# Patient Record
Sex: Male | Born: 1970 | Race: White | Hispanic: No | Smoking: Former smoker
Health system: Southern US, Community
[De-identification: ages and names within clinical notes are randomized; demographics above are authoritative.]

## PROBLEM LIST (undated history)

## (undated) VITALS — BP 132/86 | HR 105 | Temp 97.5°F | Resp 20 | Ht 72.0 in | Wt 298.0 lb

## (undated) DIAGNOSIS — F1911 Other psychoactive substance abuse, in remission: Secondary | ICD-10-CM

## (undated) DIAGNOSIS — N451 Epididymitis: Secondary | ICD-10-CM

## (undated) DIAGNOSIS — K859 Acute pancreatitis without necrosis or infection, unspecified: Secondary | ICD-10-CM

## (undated) HISTORY — PX: CHOLECYSTECTOMY: SHX55

## (undated) HISTORY — PX: KNEE SURGERY: SHX244

---

## 2002-02-03 ENCOUNTER — Encounter: Payer: Self-pay | Admitting: Family Medicine

## 2002-02-03 ENCOUNTER — Inpatient Hospital Stay (HOSPITAL_COMMUNITY): Admission: EM | Admit: 2002-02-03 | Discharge: 2002-02-08 | Payer: Self-pay | Admitting: Internal Medicine

## 2002-02-04 ENCOUNTER — Encounter: Payer: Self-pay | Admitting: Pulmonary Disease

## 2002-02-07 ENCOUNTER — Encounter: Payer: Self-pay | Admitting: Pulmonary Disease

## 2002-02-09 ENCOUNTER — Inpatient Hospital Stay (HOSPITAL_COMMUNITY): Admission: AD | Admit: 2002-02-09 | Discharge: 2002-02-11 | Payer: Self-pay | Admitting: Internal Medicine

## 2002-02-13 ENCOUNTER — Encounter: Payer: Self-pay | Admitting: Emergency Medicine

## 2002-02-13 ENCOUNTER — Inpatient Hospital Stay (HOSPITAL_COMMUNITY): Admission: EM | Admit: 2002-02-13 | Discharge: 2002-02-16 | Payer: Self-pay | Admitting: Emergency Medicine

## 2002-02-17 ENCOUNTER — Encounter: Payer: Self-pay | Admitting: Emergency Medicine

## 2002-02-17 ENCOUNTER — Emergency Department (HOSPITAL_COMMUNITY): Admission: EM | Admit: 2002-02-17 | Discharge: 2002-02-17 | Payer: Self-pay | Admitting: Emergency Medicine

## 2002-02-20 ENCOUNTER — Emergency Department (HOSPITAL_COMMUNITY): Admission: EM | Admit: 2002-02-20 | Discharge: 2002-02-20 | Payer: Self-pay | Admitting: Emergency Medicine

## 2002-02-20 ENCOUNTER — Encounter: Payer: Self-pay | Admitting: Family Medicine

## 2004-05-02 ENCOUNTER — Ambulatory Visit: Payer: Self-pay | Admitting: Psychiatry

## 2004-05-02 ENCOUNTER — Inpatient Hospital Stay (HOSPITAL_COMMUNITY): Admission: RE | Admit: 2004-05-02 | Discharge: 2004-05-04 | Payer: Self-pay | Admitting: Psychiatry

## 2006-01-02 ENCOUNTER — Emergency Department (HOSPITAL_COMMUNITY): Admission: EM | Admit: 2006-01-02 | Discharge: 2006-01-03 | Payer: Self-pay | Admitting: Emergency Medicine

## 2006-05-19 ENCOUNTER — Emergency Department (HOSPITAL_COMMUNITY): Admission: EM | Admit: 2006-05-19 | Discharge: 2006-05-19 | Payer: Self-pay | Admitting: Emergency Medicine

## 2006-05-28 ENCOUNTER — Emergency Department (HOSPITAL_COMMUNITY): Admission: EM | Admit: 2006-05-28 | Discharge: 2006-05-28 | Payer: Self-pay | Admitting: Emergency Medicine

## 2006-05-29 ENCOUNTER — Observation Stay (HOSPITAL_COMMUNITY): Admission: EM | Admit: 2006-05-29 | Discharge: 2006-05-30 | Payer: Self-pay | Admitting: Emergency Medicine

## 2006-07-24 ENCOUNTER — Emergency Department (HOSPITAL_COMMUNITY): Admission: EM | Admit: 2006-07-24 | Discharge: 2006-07-24 | Payer: Self-pay | Admitting: Emergency Medicine

## 2007-05-31 ENCOUNTER — Emergency Department (HOSPITAL_COMMUNITY): Admission: EM | Admit: 2007-05-31 | Discharge: 2007-05-31 | Payer: Self-pay | Admitting: Emergency Medicine

## 2007-09-07 ENCOUNTER — Emergency Department (HOSPITAL_COMMUNITY): Admission: EM | Admit: 2007-09-07 | Discharge: 2007-09-07 | Payer: Self-pay | Admitting: Emergency Medicine

## 2007-10-10 ENCOUNTER — Emergency Department (HOSPITAL_COMMUNITY): Admission: EM | Admit: 2007-10-10 | Discharge: 2007-10-10 | Payer: Self-pay | Admitting: Emergency Medicine

## 2009-03-06 ENCOUNTER — Inpatient Hospital Stay (HOSPITAL_COMMUNITY): Admission: EM | Admit: 2009-03-06 | Discharge: 2009-03-11 | Payer: Self-pay | Admitting: Emergency Medicine

## 2009-03-10 ENCOUNTER — Encounter (INDEPENDENT_AMBULATORY_CARE_PROVIDER_SITE_OTHER): Payer: Self-pay | Admitting: General Surgery

## 2009-03-18 ENCOUNTER — Emergency Department (HOSPITAL_COMMUNITY): Admission: EM | Admit: 2009-03-18 | Discharge: 2009-03-18 | Payer: Self-pay | Admitting: Emergency Medicine

## 2009-03-19 ENCOUNTER — Emergency Department (HOSPITAL_COMMUNITY): Admission: EM | Admit: 2009-03-19 | Discharge: 2009-03-20 | Payer: Self-pay | Admitting: Emergency Medicine

## 2009-03-23 ENCOUNTER — Encounter (HOSPITAL_COMMUNITY): Admission: RE | Admit: 2009-03-23 | Discharge: 2009-04-06 | Payer: Self-pay | Admitting: General Surgery

## 2009-03-24 ENCOUNTER — Inpatient Hospital Stay (HOSPITAL_COMMUNITY): Admission: EM | Admit: 2009-03-24 | Discharge: 2009-03-27 | Payer: Self-pay | Admitting: Emergency Medicine

## 2009-03-24 ENCOUNTER — Ambulatory Visit: Payer: Self-pay | Admitting: Internal Medicine

## 2009-03-25 ENCOUNTER — Ambulatory Visit: Payer: Self-pay | Admitting: Gastroenterology

## 2009-03-27 ENCOUNTER — Telehealth (INDEPENDENT_AMBULATORY_CARE_PROVIDER_SITE_OTHER): Payer: Self-pay | Admitting: *Deleted

## 2009-03-27 ENCOUNTER — Ambulatory Visit: Payer: Self-pay | Admitting: Internal Medicine

## 2009-03-28 ENCOUNTER — Telehealth (INDEPENDENT_AMBULATORY_CARE_PROVIDER_SITE_OTHER): Payer: Self-pay | Admitting: *Deleted

## 2009-03-28 ENCOUNTER — Encounter: Payer: Self-pay | Admitting: Gastroenterology

## 2009-03-28 DIAGNOSIS — K831 Obstruction of bile duct: Secondary | ICD-10-CM | POA: Insufficient documentation

## 2009-04-01 ENCOUNTER — Emergency Department (HOSPITAL_COMMUNITY): Admission: EM | Admit: 2009-04-01 | Discharge: 2009-04-01 | Payer: Self-pay | Admitting: Emergency Medicine

## 2009-04-20 ENCOUNTER — Telehealth: Payer: Self-pay | Admitting: Gastroenterology

## 2009-04-20 ENCOUNTER — Ambulatory Visit: Payer: Self-pay | Admitting: Gastroenterology

## 2009-04-20 ENCOUNTER — Ambulatory Visit (HOSPITAL_COMMUNITY): Admission: RE | Admit: 2009-04-20 | Discharge: 2009-04-20 | Payer: Self-pay | Admitting: Gastroenterology

## 2009-05-02 ENCOUNTER — Inpatient Hospital Stay (HOSPITAL_COMMUNITY): Admission: AD | Admit: 2009-05-02 | Discharge: 2009-05-07 | Payer: Self-pay | Admitting: Family Medicine

## 2009-05-09 ENCOUNTER — Telehealth (INDEPENDENT_AMBULATORY_CARE_PROVIDER_SITE_OTHER): Payer: Self-pay | Admitting: *Deleted

## 2009-05-12 ENCOUNTER — Encounter: Payer: Self-pay | Admitting: Internal Medicine

## 2009-05-17 ENCOUNTER — Encounter: Payer: Self-pay | Admitting: Internal Medicine

## 2009-06-28 ENCOUNTER — Encounter (INDEPENDENT_AMBULATORY_CARE_PROVIDER_SITE_OTHER): Payer: Self-pay | Admitting: *Deleted

## 2009-08-13 ENCOUNTER — Emergency Department (HOSPITAL_COMMUNITY): Admission: EM | Admit: 2009-08-13 | Discharge: 2009-08-13 | Payer: Self-pay | Admitting: Emergency Medicine

## 2010-01-14 ENCOUNTER — Emergency Department (HOSPITAL_COMMUNITY): Admission: EM | Admit: 2010-01-14 | Discharge: 2010-01-14 | Payer: Self-pay | Admitting: Emergency Medicine

## 2010-02-25 ENCOUNTER — Emergency Department (HOSPITAL_COMMUNITY): Admission: EM | Admit: 2010-02-25 | Discharge: 2010-02-25 | Payer: Self-pay | Admitting: Emergency Medicine

## 2010-03-10 ENCOUNTER — Emergency Department (HOSPITAL_COMMUNITY): Admission: EM | Admit: 2010-03-10 | Discharge: 2010-03-11 | Payer: Self-pay | Admitting: Emergency Medicine

## 2010-06-14 ENCOUNTER — Emergency Department (HOSPITAL_COMMUNITY): Admission: EM | Admit: 2010-06-14 | Discharge: 2010-03-12 | Payer: Self-pay | Admitting: Emergency Medicine

## 2010-07-29 ENCOUNTER — Encounter: Payer: Self-pay | Admitting: Orthopedic Surgery

## 2010-09-26 LAB — COMPREHENSIVE METABOLIC PANEL
Albumin: 4 g/dL (ref 3.5–5.2)
Chloride: 104 mEq/L (ref 96–112)
GFR calc non Af Amer: >60 mL/min (ref >60)
Potassium: 3.9 mEq/L (ref 3.5–5.1)
Total Protein: 7.5 g/dL (ref 6.0–8.3)

## 2010-09-26 LAB — POCT CARDIAC MARKERS: Myoglobin, poc: 50.4 ng/mL (ref 12–200)

## 2010-09-26 LAB — DIFFERENTIAL
Basophils Relative: 1 % (ref 0–1)
Eosinophils Relative: 2 % (ref 0–5)
Lymphs Abs: 2.6 10*3/uL (ref 0.7–4.0)
Monocytes Absolute: 0.8 10*3/uL (ref 0.1–1.0)
Monocytes Relative: 10 % (ref 3–12)
Neutro Abs: 4.7 10*3/uL (ref 1.7–7.7)
Neutrophils Relative %: 56 % (ref 43–77)

## 2010-09-26 LAB — CBC
MCHC: 33.4 g/dL (ref 30.0–36.0)
MCV: 97.2 fL (ref 78.0–100.0)
RDW: 14.1 % (ref 11.5–15.5)
WBC: 8.4 10*3/uL (ref 4.0–10.5)

## 2010-10-11 LAB — CBC
HCT: 36.6 % — ABNORMAL LOW (ref 39.0–52.0)
HCT: 41.6 % (ref 39.0–52.0)
HCT: 41.7 % (ref 39.0–52.0)
Hemoglobin: 12.4 g/dL — ABNORMAL LOW (ref 13.0–17.0)
Hemoglobin: 14 g/dL (ref 13.0–17.0)
MCHC: 33.8 g/dL (ref 30.0–36.0)
MCV: 96.7 fL (ref 78.0–100.0)
MCV: 97.3 fL (ref 78.0–100.0)
Platelets: 330 10*3/uL (ref 150–400)
RBC: 3.78 MIL/uL — ABNORMAL LOW (ref 4.22–5.81)
RDW: 14.8 % (ref 11.5–15.5)
RDW: 15.5 % (ref 11.5–15.5)
WBC: 12.4 10*3/uL — ABNORMAL HIGH (ref 4.0–10.5)
WBC: 8.8 10*3/uL (ref 4.0–10.5)

## 2010-10-11 LAB — DIFFERENTIAL
Eosinophils Absolute: 0.2 10*3/uL (ref 0.0–0.7)
Eosinophils Relative: 1 % (ref 0–5)
Eosinophils Relative: 2 % (ref 0–5)
Eosinophils Relative: 3 % (ref 0–5)
Lymphocytes Relative: 25 % (ref 12–46)
Lymphs Abs: 2.4 10*3/uL (ref 0.7–4.0)
Lymphs Abs: 3.3 10*3/uL (ref 0.7–4.0)
Monocytes Absolute: 0.9 10*3/uL (ref 0.1–1.0)
Monocytes Absolute: 1.3 10*3/uL — ABNORMAL HIGH (ref 0.1–1.0)
Monocytes Relative: 10 % (ref 3–12)
Monocytes Relative: 11 % (ref 3–12)
Monocytes Relative: 11 % (ref 3–12)
Neutro Abs: 4.3 10*3/uL (ref 1.7–7.7)
Neutro Abs: 7.8 10*3/uL — ABNORMAL HIGH (ref 1.7–7.7)
Neutrophils Relative %: 49 % (ref 43–77)

## 2010-10-11 LAB — COMPREHENSIVE METABOLIC PANEL
AST: 13 U/L (ref 0–37)
Albumin: 3.7 g/dL (ref 3.5–5.2)
BUN: 8 mg/dL (ref 6–23)
Chloride: 107 mEq/L (ref 96–112)
Creatinine, Ser: 0.69 mg/dL (ref 0.4–1.5)
GFR calc Af Amer: >60 mL/min (ref >60)
Potassium: 3.8 mEq/L (ref 3.5–5.1)
Total Protein: 7.1 g/dL (ref 6.0–8.3)

## 2010-10-11 LAB — BASIC METABOLIC PANEL
CO2: 28 mEq/L (ref 19–32)
Chloride: 104 mEq/L (ref 96–112)
Chloride: 108 mEq/L (ref 96–112)
GFR calc Af Amer: >60 mL/min (ref >60)
GFR calc non Af Amer: >60 mL/min (ref >60)
Glucose, Bld: 86 mg/dL (ref 70–99)
Potassium: 4.1 mEq/L (ref 3.5–5.1)
Potassium: 4.4 mEq/L (ref 3.5–5.1)
Sodium: 140 mEq/L (ref 135–145)
Sodium: 140 mEq/L (ref 135–145)

## 2010-10-11 LAB — AMYLASE: Amylase: 611 U/L — ABNORMAL HIGH (ref 27–131)

## 2010-10-11 LAB — LIPID PANEL
LDL Cholesterol: 96 mg/dL (ref 0–99)
Total CHOL/HDL Ratio: 6.8 RATIO
Triglycerides: 243 mg/dL — ABNORMAL HIGH (ref <150)
VLDL: 49 mg/dL — ABNORMAL HIGH (ref 0–40)

## 2010-10-11 LAB — LIPASE, BLOOD
Lipase: 128 U/L — ABNORMAL HIGH (ref 11–59)
Lipase: 350 U/L — ABNORMAL HIGH (ref 11–59)

## 2010-10-12 LAB — COMPREHENSIVE METABOLIC PANEL
ALT: 15 U/L (ref 0–53)
ALT: 16 U/L (ref 0–53)
ALT: 26 U/L (ref 0–53)
ALT: 13 U/L (ref 0–53)
AST: 15 U/L (ref 0–37)
AST: 18 U/L (ref 0–37)
AST: 22 U/L (ref 0–37)
Albumin: 3.3 g/dL — ABNORMAL LOW (ref 3.5–5.2)
Albumin: 3.4 g/dL — ABNORMAL LOW (ref 3.5–5.2)
Albumin: 4 g/dL (ref 3.5–5.2)
Albumin: 4.1 g/dL (ref 3.5–5.2)
Alkaline Phosphatase: 73 U/L (ref 39–117)
Alkaline Phosphatase: 77 U/L (ref 39–117)
BUN: 11 mg/dL (ref 6–23)
BUN: 7 mg/dL (ref 6–23)
BUN: 9 mg/dL (ref 6–23)
BUN: 8 mg/dL (ref 6–23)
CO2: 30 mEq/L (ref 19–32)
CO2: 33 mEq/L — ABNORMAL HIGH (ref 19–32)
CO2: 24 mEq/L (ref 19–32)
Calcium: 10 mg/dL (ref 8.4–10.5)
Calcium: 10.1 mg/dL (ref 8.4–10.5)
Calcium: 9.1 mg/dL (ref 8.4–10.5)
Calcium: 9.7 mg/dL (ref 8.4–10.5)
Chloride: 100 mEq/L (ref 96–112)
Chloride: 97 mEq/L (ref 96–112)
Chloride: 99 mEq/L (ref 96–112)
Creatinine, Ser: 0.79 mg/dL (ref 0.4–1.5)
Creatinine, Ser: 0.84 mg/dL (ref 0.4–1.5)
Creatinine, Ser: 0.88 mg/dL (ref 0.4–1.5)
GFR calc Af Amer: >60 mL/min (ref >60)
GFR calc Af Amer: >60 mL/min (ref >60)
GFR calc Af Amer: >60 mL/min (ref >60)
GFR calc non Af Amer: >60 mL/min (ref >60)
GFR calc non Af Amer: >60 mL/min (ref >60)
GFR calc non Af Amer: >60 mL/min (ref >60)
Glucose, Bld: 101 mg/dL — ABNORMAL HIGH (ref 70–99)
Glucose, Bld: 114 mg/dL — ABNORMAL HIGH (ref 70–99)
Glucose, Bld: 88 mg/dL (ref 70–99)
Glucose, Bld: 103 mg/dL — ABNORMAL HIGH (ref 70–99)
Potassium: 4 mEq/L (ref 3.5–5.1)
Potassium: 4.1 mEq/L (ref 3.5–5.1)
Potassium: 4.5 mEq/L (ref 3.5–5.1)
Sodium: 132 mEq/L — ABNORMAL LOW (ref 135–145)
Sodium: 135 mEq/L (ref 135–145)
Sodium: 136 mEq/L (ref 135–145)
Sodium: 141 mEq/L (ref 135–145)
Total Bilirubin: 0.3 mg/dL (ref 0.3–1.2)
Total Bilirubin: 0.5 mg/dL (ref 0.3–1.2)
Total Bilirubin: 0.7 mg/dL (ref 0.3–1.2)
Total Protein: 6.8 g/dL (ref 6.0–8.3)
Total Protein: 7.6 g/dL (ref 6.0–8.3)
Total Protein: 8 g/dL (ref 6.0–8.3)

## 2010-10-12 LAB — AMYLASE
Amylase: 113 U/L (ref 27–131)
Amylase: 354 U/L — ABNORMAL HIGH (ref 27–131)
Amylase: 170 U/L — ABNORMAL HIGH (ref 27–131)
Amylase: 76 U/L (ref 27–131)

## 2010-10-12 LAB — URINALYSIS, ROUTINE W REFLEX MICROSCOPIC
Bilirubin Urine: NEGATIVE
Glucose, UA: NEGATIVE mg/dL
Hgb urine dipstick: NEGATIVE
Hgb urine dipstick: NEGATIVE
Ketones, ur: NEGATIVE mg/dL
Specific Gravity, Urine: 1.02 (ref 1.005–1.030)
Specific Gravity, Urine: 1.01 (ref 1.005–1.030)
pH: 7 (ref 5.0–8.0)
pH: 6 (ref 5.0–8.0)

## 2010-10-12 LAB — DIFFERENTIAL
Band Neutrophils: 0 % (ref 0–10)
Basophils Absolute: 0 10*3/uL (ref 0.0–0.1)
Basophils Absolute: 0 10*3/uL (ref 0.0–0.1)
Basophils Absolute: 0 10*3/uL (ref 0.0–0.1)
Basophils Absolute: 0.1 10*3/uL (ref 0.0–0.1)
Basophils Relative: 0 % (ref 0–1)
Eosinophils Absolute: 0.2 10*3/uL (ref 0.0–0.7)
Eosinophils Absolute: 0.3 10*3/uL (ref 0.0–0.7)
Eosinophils Absolute: 0.2 10*3/uL (ref 0.0–0.7)
Eosinophils Absolute: 0.2 10*3/uL (ref 0.0–0.7)
Eosinophils Relative: 2 % (ref 0–5)
Eosinophils Relative: 4 % (ref 0–5)
Eosinophils Relative: 1 % (ref 0–5)
Eosinophils Relative: 2 % (ref 0–5)
Lymphocytes Relative: 15 % (ref 12–46)
Lymphocytes Relative: 22 % (ref 12–46)
Lymphocytes Relative: 18 % (ref 12–46)
Lymphocytes Relative: 32 % (ref 12–46)
Lymphs Abs: 2.6 10*3/uL (ref 0.7–4.0)
Lymphs Abs: 3.8 10*3/uL (ref 0.7–4.0)
Lymphs Abs: 2.9 10*3/uL (ref 0.7–4.0)
Lymphs Abs: 3 10*3/uL (ref 0.7–4.0)
Monocytes Absolute: 0.9 10*3/uL (ref 0.1–1.0)
Monocytes Absolute: 1 10*3/uL (ref 0.1–1.0)
Monocytes Absolute: 1.1 10*3/uL — ABNORMAL HIGH (ref 0.1–1.0)
Monocytes Absolute: 1.3 10*3/uL — ABNORMAL HIGH (ref 0.1–1.0)
Monocytes Absolute: 1 10*3/uL (ref 0.1–1.0)
Monocytes Relative: 8 % (ref 3–12)
Monocytes Relative: 9 % (ref 3–12)
Neutro Abs: 12.4 10*3/uL — ABNORMAL HIGH (ref 1.7–7.7)
Neutro Abs: 6.2 10*3/uL (ref 1.7–7.7)
Neutro Abs: 7.1 10*3/uL (ref 1.7–7.7)
Neutro Abs: 13.5 10*3/uL — ABNORMAL HIGH (ref 1.7–7.7)
Neutro Abs: 5.4 10*3/uL (ref 1.7–7.7)
Neutrophils Relative %: 57 % (ref 43–77)
Neutrophils Relative %: 63 % (ref 43–77)
Neutrophils Relative %: 55 % (ref 43–77)
Neutrophils Relative %: 73 % (ref 43–77)

## 2010-10-12 LAB — HEPATIC FUNCTION PANEL
AST: 29 U/L (ref 0–37)
Albumin: 3.1 g/dL — ABNORMAL LOW (ref 3.5–5.2)
Alkaline Phosphatase: 90 U/L (ref 39–117)
Total Protein: 6 g/dL (ref 6.0–8.3)

## 2010-10-12 LAB — CBC
HCT: 39.1 % (ref 39.0–52.0)
HCT: 43.3 % (ref 39.0–52.0)
HCT: 43.7 % (ref 39.0–52.0)
HCT: 36.4 % — ABNORMAL LOW (ref 39.0–52.0)
HCT: 42.7 % (ref 39.0–52.0)
Hemoglobin: 13.7 g/dL (ref 13.0–17.0)
Hemoglobin: 14.7 g/dL (ref 13.0–17.0)
Hemoglobin: 15.1 g/dL (ref 13.0–17.0)
Hemoglobin: 14.7 g/dL (ref 13.0–17.0)
MCHC: 34 g/dL (ref 30.0–36.0)
MCHC: 34.1 g/dL (ref 30.0–36.0)
MCHC: 34.6 g/dL (ref 30.0–36.0)
MCHC: 34.5 g/dL (ref 30.0–36.0)
MCHC: 35 g/dL (ref 30.0–36.0)
MCV: 94.8 fL (ref 78.0–100.0)
MCV: 95.7 fL (ref 78.0–100.0)
MCV: 95.9 fL (ref 78.0–100.0)
MCV: 95.7 fL (ref 78.0–100.0)
MCV: 96.1 fL (ref 78.0–100.0)
MCV: 96.9 fL (ref 78.0–100.0)
Platelets: 331 10*3/uL (ref 150–400)
Platelets: 339 10*3/uL (ref 150–400)
Platelets: 401 10*3/uL — ABNORMAL HIGH (ref 150–400)
Platelets: 433 10*3/uL — ABNORMAL HIGH (ref 150–400)
Platelets: 219 10*3/uL (ref 150–400)
RBC: 3.81 MIL/uL — ABNORMAL LOW (ref 4.22–5.81)
RBC: 4.19 MIL/uL — ABNORMAL LOW (ref 4.22–5.81)
RBC: 4.56 MIL/uL (ref 4.22–5.81)
RBC: 3.77 MIL/uL — ABNORMAL LOW (ref 4.22–5.81)
RBC: 3.79 MIL/uL — ABNORMAL LOW (ref 4.22–5.81)
RBC: 4.47 MIL/uL (ref 4.22–5.81)
RDW: 13.3 % (ref 11.5–15.5)
RDW: 13.6 % (ref 11.5–15.5)
RDW: 13.6 % (ref 11.5–15.5)
RDW: 13.9 % (ref 11.5–15.5)
WBC: 10 10*3/uL (ref 4.0–10.5)
WBC: 16 10*3/uL — ABNORMAL HIGH (ref 4.0–10.5)
WBC: 7.8 10*3/uL (ref 4.0–10.5)
WBC: 9 10*3/uL (ref 4.0–10.5)
WBC: 9.7 10*3/uL (ref 4.0–10.5)

## 2010-10-12 LAB — BASIC METABOLIC PANEL
BUN: 3 mg/dL — ABNORMAL LOW (ref 6–23)
CO2: 28 mEq/L (ref 19–32)
Chloride: 111 mEq/L (ref 96–112)
GFR calc Af Amer: >60 mL/min (ref >60)
Potassium: 3.7 mEq/L (ref 3.5–5.1)

## 2010-10-12 LAB — LIPASE, BLOOD
Lipase: 145 U/L — ABNORMAL HIGH (ref 11–59)
Lipase: 333 U/L — ABNORMAL HIGH (ref 11–59)
Lipase: 88 U/L — ABNORMAL HIGH (ref 11–59)
Lipase: 100 U/L — ABNORMAL HIGH (ref 11–59)
Lipase: 36 U/L (ref 11–59)

## 2010-10-12 LAB — POCT CARDIAC MARKERS
CKMB, poc: <1 ng/mL — ABNORMAL LOW (ref 1.0–8.0)
Myoglobin, poc: 91.9 ng/mL (ref 12–200)

## 2010-10-13 LAB — RAPID URINE DRUG SCREEN, HOSP PERFORMED
Barbiturates: NOT DETECTED
Opiates: POSITIVE — AB

## 2010-10-13 LAB — CBC
HCT: 44.7 % (ref 39.0–52.0)
Hemoglobin: 13.3 g/dL (ref 13.0–17.0)
MCHC: 34.4 g/dL (ref 30.0–36.0)
MCV: 98 fL (ref 78.0–100.0)
Platelets: 322 10*3/uL (ref 150–400)
RBC: 3.94 MIL/uL — ABNORMAL LOW (ref 4.22–5.81)
WBC: 11.8 10*3/uL — ABNORMAL HIGH (ref 4.0–10.5)

## 2010-10-13 LAB — POCT CARDIAC MARKERS
CKMB, poc: <1 ng/mL — ABNORMAL LOW (ref 1.0–8.0)
Myoglobin, poc: 63.4 ng/mL (ref 12–200)
Troponin i, poc: <0.05 ng/mL (ref 0.00–0.09)

## 2010-10-13 LAB — HEPATIC FUNCTION PANEL
AST: 75 U/L — ABNORMAL HIGH (ref 0–37)
Alkaline Phosphatase: 91 U/L (ref 39–117)
Bilirubin, Direct: 0.1 mg/dL (ref 0.0–0.3)
Total Bilirubin: 0.5 mg/dL (ref 0.3–1.2)

## 2010-10-13 LAB — COMPREHENSIVE METABOLIC PANEL
AST: 52 U/L — ABNORMAL HIGH (ref 0–37)
Albumin: 4 g/dL (ref 3.5–5.2)
Alkaline Phosphatase: 93 U/L (ref 39–117)
BUN: 7 mg/dL (ref 6–23)
Chloride: 100 mEq/L (ref 96–112)
Potassium: 4.4 mEq/L (ref 3.5–5.1)
Total Bilirubin: 0.7 mg/dL (ref 0.3–1.2)

## 2010-10-13 LAB — LIPID PANEL
Cholesterol: 183 mg/dL (ref 0–200)
Total CHOL/HDL Ratio: 5.7 RATIO

## 2010-10-13 LAB — DIFFERENTIAL
Basophils Absolute: 0.1 10*3/uL (ref 0.0–0.1)
Basophils Relative: 0 % (ref 0–1)
Basophils Relative: 1 % (ref 0–1)
Eosinophils Absolute: 0.2 10*3/uL (ref 0.0–0.7)
Eosinophils Absolute: 0.2 10*3/uL (ref 0.0–0.7)
Eosinophils Relative: 1 % (ref 0–5)
Lymphs Abs: 3.3 10*3/uL (ref 0.7–4.0)
Monocytes Absolute: 0.7 10*3/uL (ref 0.1–1.0)
Monocytes Absolute: 1.2 10*3/uL — ABNORMAL HIGH (ref 0.1–1.0)
Monocytes Relative: 10 % (ref 3–12)
Neutro Abs: 7.2 10*3/uL (ref 1.7–7.7)

## 2010-11-16 IMAGING — CT CT ABDOMEN W/ CM
2 of 3 series · 12 of 36 positions shown, 17 images · IV contrast (Omnipaque 300)
Comparison: Abdominal ultrasound performed 03/06/2009

CT ABDOMEN

CLINICAL DATA: Abdominal pain and vomiting; status post
cholecystectomy 1 week ago.

CT ABDOMEN AND PELVIS WITH CONTRAST
TECHNIQUE: Multidetector CT imaging of the abdomen and pelvis was
performed using the standard protocol following bolus
administration of intravenous contrast.
Contrast: 100 mL Omnipaque 300 IV contrast

[Series 2: abd_pel_with 5.0 b40f · axial · 0.81mm/px · z∈[-574,-134]mm · 11 of 101 slices shown, 15 images]
[im 9/101  soft-tissue]
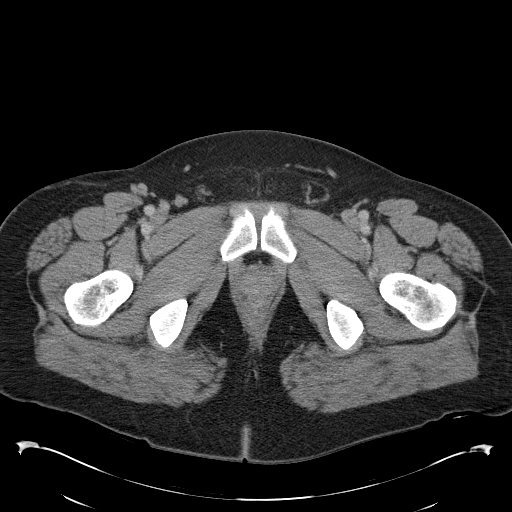
[im 9/101  bone]
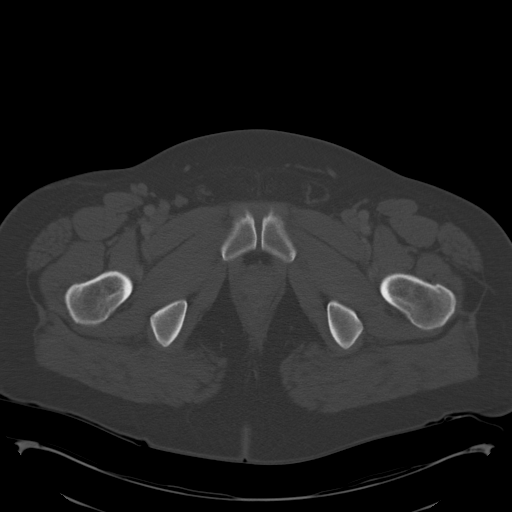
[im 21/101  soft-tissue]
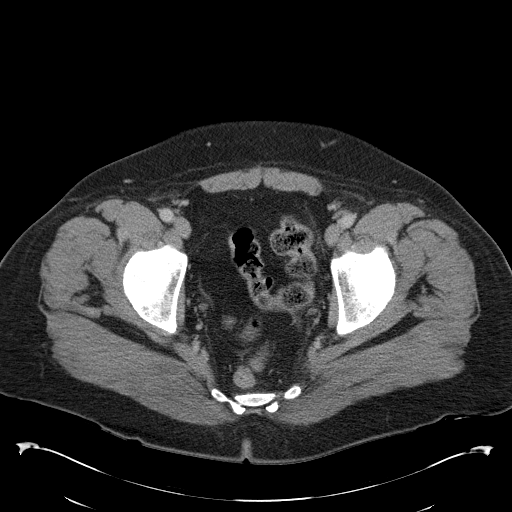
[im 29/101  soft-tissue]
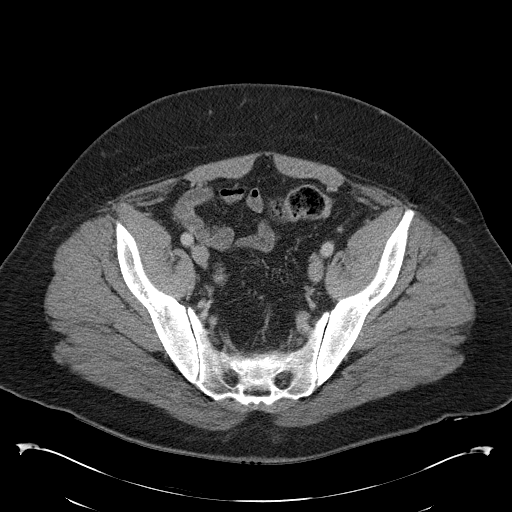
[im 41/101  soft-tissue]
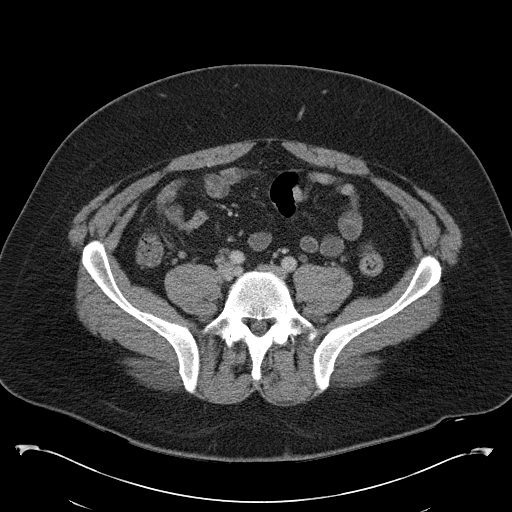
[im 53/101  soft-tissue]
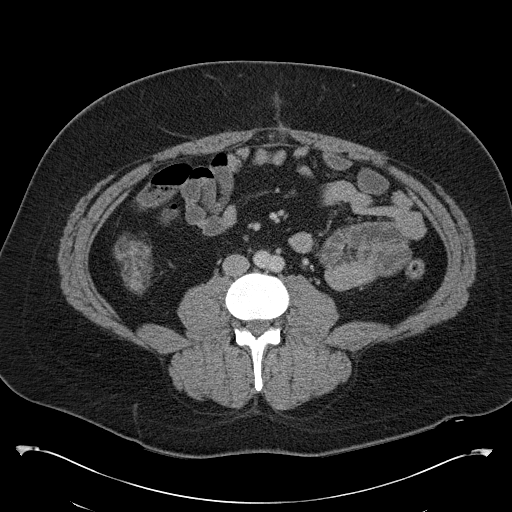
[im 61/101  soft-tissue]
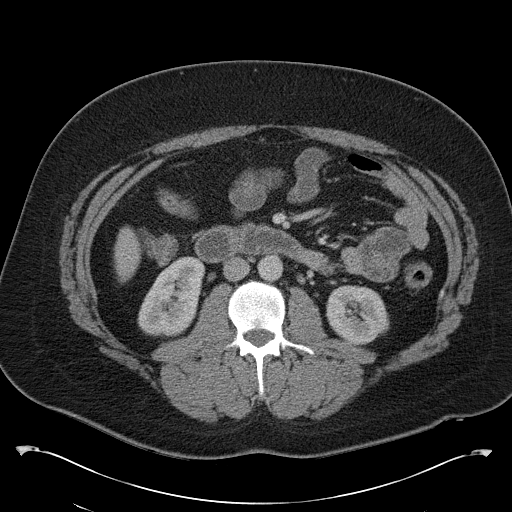
[im 73/101  soft-tissue]
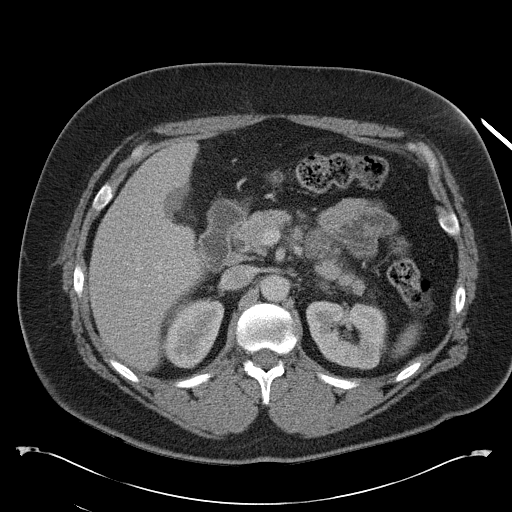
[im 85/101  soft-tissue]
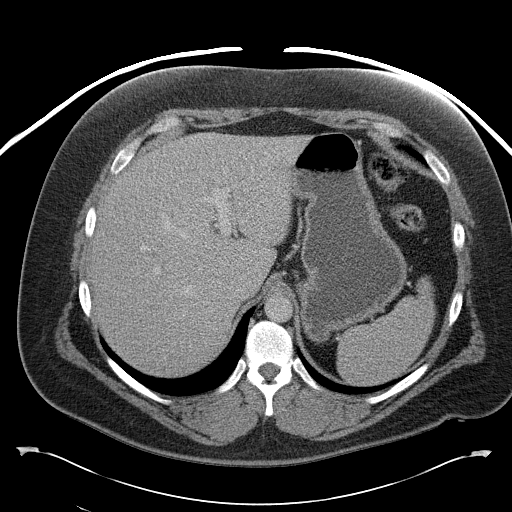
[im 85/101  lung]
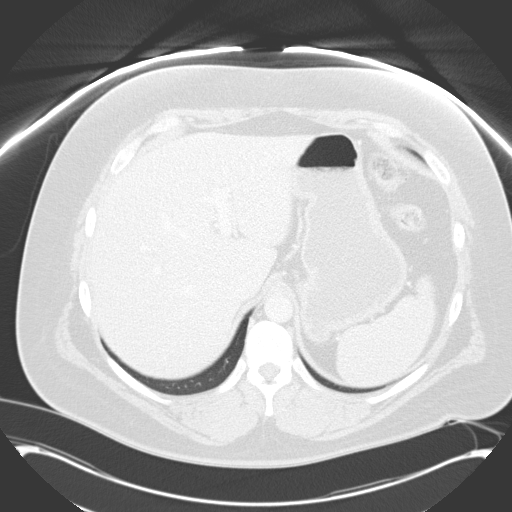
[im 89/101  lung]
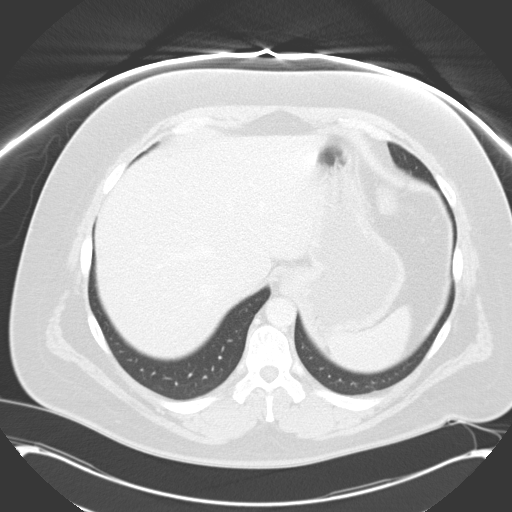
[im 93/101  soft-tissue]
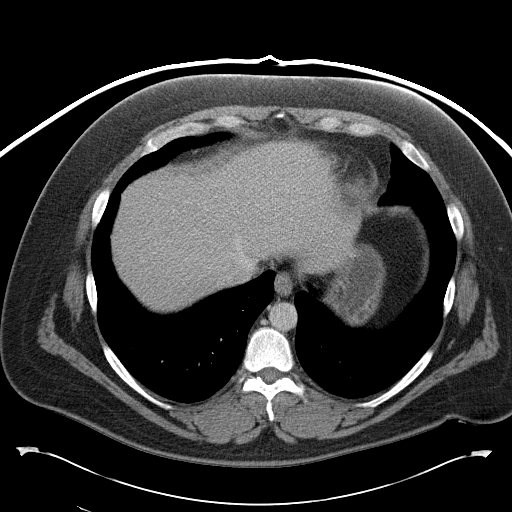
[im 93/101  lung]
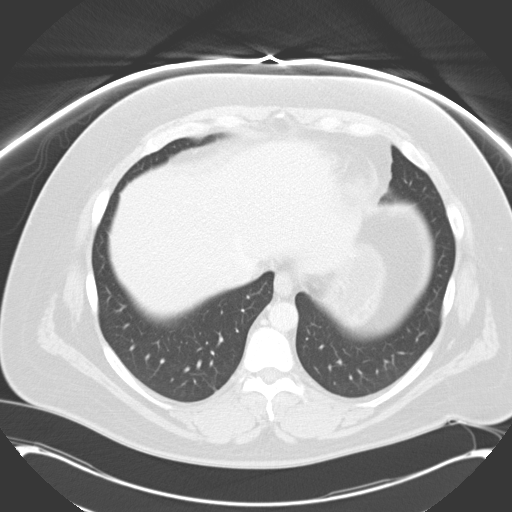
[im 93/101  bone]
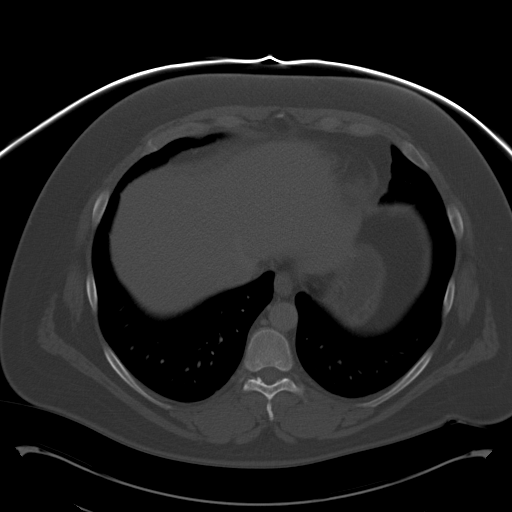
[im 97/101  lung]
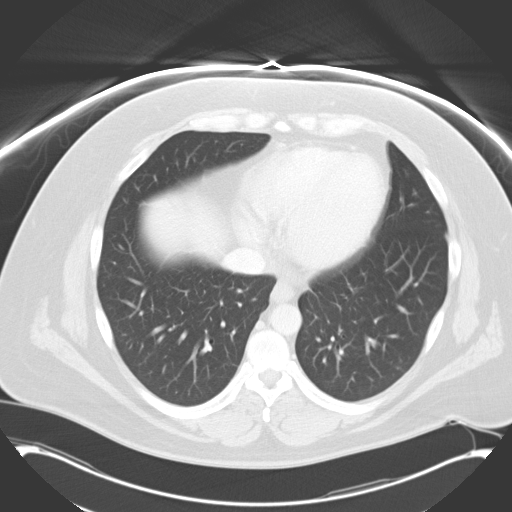

[Series 5: mpr sag post contrast (id) · sagittal · 0.71mm/px · 1 of 129 slices shown, 2 images]
[im 43/129  soft-tissue]
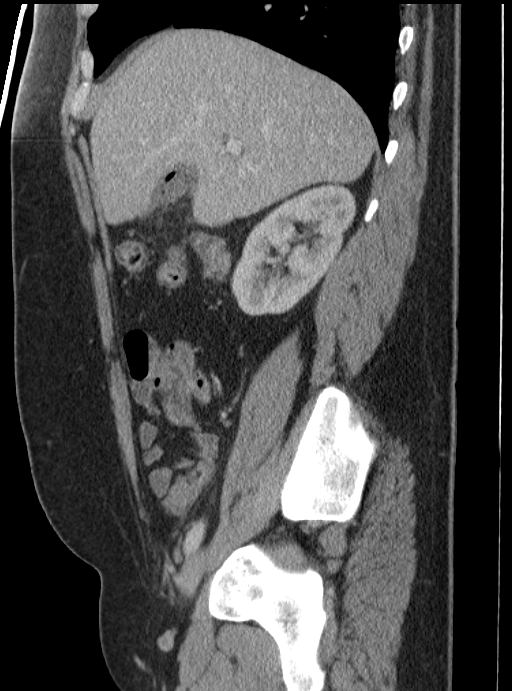
[im 43/129  bone]
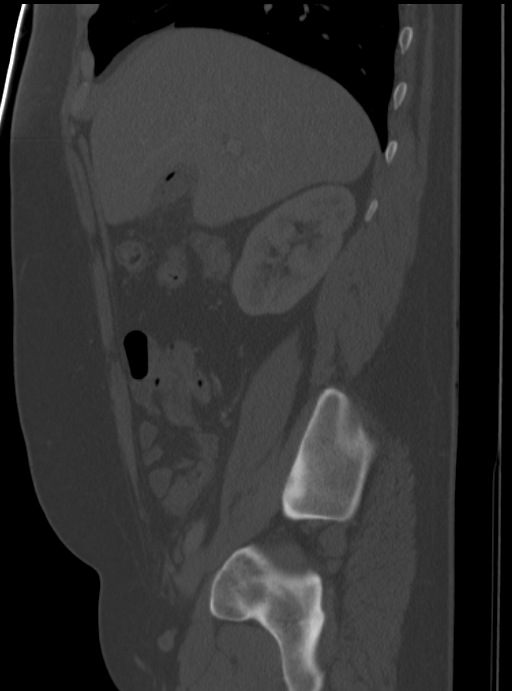

[12 of 36 positions shown; findings below may reference images not displayed]

FINDINGS: The visualized lung bases are clear.

A small collection of fluid and air is noted at the gallbladder
fossa, status post cholecystectomy.  This collection measures
approximately 3.4 x 3.1 x 2.5 cm.  There is no evidence of
peripheral enhancement to suggest abscess formation, but infection
cannot be excluded.  No retained stones are identified.  Adjacent
clips are noted.  There is no evidence of free fluid in the
remainder of the abdomen.  Postoperative stranding is noted in the
anterior abdominal wall.

The liver and spleen are unremarkable in appearance.  The common
bile duct remains normal in caliber, status post cholecystectomy.
The pancreas and adrenal glands are unremarkable.  The kidneys are
within normal limits bilaterally; no hydronephrosis or perinephric
stranding is seen.

A trace amount of intraperitoneal air is noted inferior to the
level of the umbilicus, likely reflecting minimal residual air
trapped in the omentum status post surgery.  The small bowel is
unremarkable in appearance.  The stomach is within normal limits.
No acute vascular abnormalities are seen.

No acute osseous abnormalities are identified.
IMPRESSION: 1.  Small 3.4 cm collection of fluid and air at the gallbladder
fossa, status post cholecystectomy; no evidence of peripheral
enhancement to suggest abscess formation, but infection cannot be
excluded.
2.  Trace amount of intraperitoneal air noted inferior to the
umbilicus, likely reflecting minimal residual air trapped in the
omentum status post surgery.  Postoperative stranding noted in the
anterior abdominal wall.
3.  Otherwise unremarkable CT of the abdomen.

CT PELVIS
FINDINGS: No free fluid is identified within the pelvis.  The colon
is unremarkable in appearance; the appendix is not readily
characterized.

The bladder is mildly distend and within normal limits. The
prostate is normal in size and unremarkable in appearance.  A small
left umbilical hernia is noted, containing only fat.  There is no
evidence of inguinal lymphadenopathy.

No acute osseous abnormalities are seen.
IMPRESSION: 1.  Small left umbilical hernia, containing only fat.
2.  Otherwise unremarkable CT of the pelvis.

## 2010-11-20 NOTE — H&P (Signed)
NAME:  [name] [name], [name] [name]                ACCOUNT NO.:  [id number]   MEDICAL RECORD NO.:  [id number]          PATIENT TYPE:  INP   LOCATION:  A311                          FACILITY:  [facility]   PHYSICIAN:  Scott A. Gerda Diss, MD    DATE OF BIRTH:  11/04/1970   DATE OF ADMISSION:  03/06/2009  DATE OF DISCHARGE:  LH                              HISTORY & PHYSICAL   CHIEF COMPLAINT:  Chest pain, dizziness, sweating   HISTORY OF PRESENT ILLNESS:  This 40 year old male relates he has had a  couple days of just not feeling good, slight nausea, some abdominal  discomfort, not his usual self.  Denied high fever.  Denies wheezing,  coughing, vomiting or diarrhea, but on the day of admission started  having chest pain and discomfort that he states was very intense and  became persistent discomfort in the upper epigastrium and lower chest,  radiating to his back.  Some nausea with him breaking out in sweats,  feeling chilled but no documented fever.  States the pain became  unbearable and he presented to the ER because he is worried about the  possibility of a heart attack.  He was evaluated there and felt to have  pancreatitis, and we were called to admit the patient.   PAST MEDICAL HISTORY:  Patient does not have a cardiac history.  Has a  history of chronic anxiety.  Also a history of left knee reconstruction.  In addition to this, has a family history of breast cancer,  hypertension, and heart disease.   Denies allergies but has had side effects to AMBIEN, CIPRO, HYCODAN, and  DARVOCET.   Denies smoking.  States he does not drink.  He is divorced, currently.   MEDICATIONS:  None although he does state he took some Vicodin recently  for a bad tooth that his dentist prescribed.  He also states he took an  Ativan a few days back that he had left over.   PHYSICAL EXAMINATION:  VITAL SIGNS:  Overall stable.  HEENT:  Tympanic membranes normal.  __________.  Moist mucous membranes.  NECK:  Supple.  CHEST:  Clear to auscultation.  No crackles.  Heart is regular.  ABDOMEN:  Soft with mild epigastric tenderness.  EXTREMITIES: No edema.  SKIN:  Warm, dry.  NEUROLOGIC:  Grossly normal.   LABS:  White count slightly elevated.  Sodium and potassium looks good.  Creatinine looks good.  GOT slightly elevated to GPT at 71.  Cardiac  enzymes negative.  Drug screen positive for opiates and benzodiazepines.  Lipase 1580.   X-RAY:  Stable cardiomegaly.   Abdominal ultrasound:  Multiple gallstones without biliary obstruction.   ASSESSMENT/PLAN:  Pancreatitis:  Could have been triggered by  gallstones.  Could have been triggered by elevated triglycerides.  Check  a lipid profile in the morning.  Recheck a lipase in the morning.  May  need GI consult if the patient's course does not show significant  improvement.  IV fluids, pain medicine, nausea medicine, Protonix.  Monitor closely.  Pain medicine as necessary.  Caution drowsiness.  To  be  rechecked again first thing in the morning.      Scott A. Gerda Diss, MD  Electronically Signed     SAL/MEDQ  D:  03/06/2009  T:  03/06/2009  Job:  [id number]

## 2010-11-20 NOTE — Consult Note (Signed)
NAME:  [name] [name], [name] [name]                ACCOUNT NO.:  [id number]   MEDICAL RECORD NO.:  [id number]          PATIENT TYPE:  INP   LOCATION:  A311                          FACILITY:  [facility]   PHYSICIAN:  Tilford Pillar, MD      DATE OF BIRTH:  1971/07/07   DATE OF CONSULTATION:  03/07/2009  DATE OF DISCHARGE:                                 CONSULTATION   REASON FOR CONSULTATION:  Acute abdominal pain and acute pancreatitis.   HISTORY OF PRESENT ILLNESS:  The patient is a 40 year old male,  relatively healthy, who presented to [facility] with acute  onset of epigastric pain.  There is some radiation to the back.  This  has been severe to the point where the patient felt initially he was  having a heart attack and thus presented to the emergency department.  He denies any exacerbating or relieving factors.  He has never had any  similar symptomatology in the past.  He denies any emesis, but has had  some slight nausea.  He has had no change in bowel movements.  No  melena.  No hematochezia.  He denies any jaundice.  He has had no fever  and chills.  On further discussion with the patient, he does state that  he has had some indigestion symptoms over the last month with nothing  this severe or this constant.   PAST MEDICAL HISTORY:  History of anxiety.   PAST SURGICAL HISTORY:  He has a previous left knee reconstruction.   MEDICATIONS:  Ativan.   ALLERGIES:  No known drug allergies.   SOCIAL HISTORY:  No tobacco.  No alcohol.  No recreational drug abuse.   PHYSICAL EXAMINATION:  VITAL SIGNS:  Temperature 97.1, heart rate 55,  respirations 20, blood pressure 137/92.  He is 98% O2 saturation on room  air.  GENERAL:  He is in a supine position in his hospital bed.  He is not in  any acute distress.  He is alert and oriented x3.  HEENT:  Scalp no deformities.  No masses.  EYES:  Pupils are equal,  round, reactive to light.  Extraocular movements are intact.  No  conjunctival pallor  or scleral icterus is noted.  Oral mucosa is pink.  Normal occlusion.  NECK:  Trachea is midline.  PULMONARY:  Unlabored respirations.  He is clear to auscultation  bilaterally.  CARDIOVASCULAR:  Regular rate and rhythm.  No murmurs or gallops are  apparent.  He has is 2+ radial and femoral pulses and 2+ dorsalis pedis  and posterior tibialis pulses bilaterally.  ABDOMEN:  Diminished bowel sounds.  Abdomen is obese, soft.  He has mild-  to-moderate epigastric abdominal pain.  He has no diffuse peritoneal  signs.  No masses or hernias were appreciated.  EXTREMITIES:  Warm and dry.   PERTINENT LABORATORY AND RADIOGRAPHIC STUDIES:  CBC, white blood cell  count 11.9, hemoglobin 13.3, hematocrit 38.7, platelets 262.  Basic  metabolic panel from admission was sodium of 140, potassium 4.4,  chloride 100, bicarb 32, BUN 7, creatinine 0.94, blood glucose 128.  Liver  enzymes are slightly elevated at 75 and 99 for AST and ALT  respectively.  Amylase and lipase are elevated at 41 and 276, again  respectively.  Lipase has dropped from admission of 1580 to the current  276.  Triglycerides are slightly elevated at 166.  Right upper quadrant  ultrasound demonstrates positive cholelithiasis.  No biliary tree  dilatation.   ASSESSMENT AND PLAN:  Acute pancreatitis.  At this time, I would be  highly suspicious of a gallstone etiology given the patient's clinical  history and presentation.  At this time, I did discuss with the patient  the pathophysiology of gallstone pancreatitis and will continue the  patient on continued bowel rest and IV fluid hydration as well as pain  analgesia.  Risks, benefits, and alternatives of a laparoscopic possible  open cholecystectomy were also discussed with the patient briefly at the  time of evaluation including, but not limited to risk of bleeding,  infection, bile leak, small bowel injury, bile duct injury as well as  possibility intraoperative cardiac and pulmonary  events.  At this time,  I did discuss with the patient the possibility of proceeding with a  cholecystectomy upon improvement of his pancreatitis and did discuss  with the patient the possible course including resolution of his pain  symptomatology, normalization of his amylase and lipase, normalization  of his leukocytosis, and initiation of oral intake.  I did discuss with  the patient that should he be advanced on the diet and tolerate this,  then we would plan to proceed with the operation the following day upon  tolerating regular diet.  He understands this and does wish to proceed  in this fashion at this time.  I will continue to follow this patient  during his hospitalization and appreciate the opportunity to assist in  this patient's care.      Tilford Pillar, MD  Electronically Signed     BZ/MEDQ  D:  03/07/2009  T:  03/08/2009  Job:  [id number]

## 2010-11-23 NOTE — Procedures (Signed)
NAME:  [name] [name], [name] [name]                ACCOUNT NO.:  [id number]   MEDICAL RECORD NO.:  [id number]          PATIENT TYPE:  EMS   LOCATION:  ED                            FACILITY:  [facility]   PHYSICIAN:  Donna Bernard, M.D.DATE OF BIRTH:  [birth year]   DATE OF PROCEDURE:  05/28/2006  DATE OF DISCHARGE:                                  STRESS TEST   Electrocardiogram reveals sinus tachycardia with no significant STT  changes.      Donna Bernard, M.D.  Electronically Signed     WSL/MEDQ  D:  07/27/2006  T:  07/27/2006  Job:  [id number]

## 2010-11-23 NOTE — H&P (Signed)
NAME:  [name] [name], [name] [name]                ACCOUNT NO.:  [id number]   MEDICAL RECORD NO.:  [id number]           PATIENT TYPE:   LOCATION:                                 FACILITY:   PHYSICIAN:  Donna Bernard, M.D.DATE OF BIRTH:  1970/11/11   DATE OF ADMISSION:  DATE OF DISCHARGE:  LH                              HISTORY & PHYSICAL   CHIEF COMPLAINT:  Swelling of the throat and lip, trouble swallowing.   SUBJECTIVE:  The patient is a 40 year old male who presented to the room  emergency room a couple nights ago after taking his very first dose of  Ambien.  The patient had a sense of shortness of breath and swelling and  a sense of tightness in his chest.  He had some itching on his skin.  The patient was given IV steroids and antihistamines and discharged home  with prednisone taper.  He returned to the hospital this morning two  days after this with a resurgence of rash that had appeared on his  palms.  This was accompanied by severe itching along with pain in the  hands.  He had swelling of his lower lip.  The patient also had swelling  of his throat and a sense of difficulty swallowing.  It did not affect  his voice.  The patient notes no major difficulty breathing at this  time.  The patient is compliant with his chronic medications which  include lorazepam p.r.n. 2 mg, Effexor 75 mg XR daily.   PAST SURGICAL HISTORY:  Remote knee surgery.   SOCIAL HISTORY:  Patient is divorced, two children.  No alcohol or  tobacco abuse.   PRIOR MEDICAL HISTORY:  Significant for pneumonia.   FAMILY HISTORY:  Noncontributory.   ALLERGIES:  MORPHINE and now AMBIEN.   REVIEW OF SYSTEMS:  Otherwise negative.   PHYSICAL EXAMINATION:  VITAL SIGNS:  142/80, O2 sat normal.  GENERAL:  The patient is alert, anxious appearing with significant  swelling of his lower lip, no obvious tachypnea.  HEENT: Swelling of the lip noted.  Throat appears mildly swollen also on  exam.  Neck is supple.  TMs  normal, fundi disks sharp.  LUNGS:  No  wheezes.  No stridor no tachypnea.  HEART:  Regular rate and rhythm.  ABDOMEN:  Soft.  EXTREMITIES:  Hands show urticarial rash with some apparent swelling.  NEURO:  Intact.   IMPRESSION:  Recurrent angioedema with throat symptoms of difficulty  swallowing accompanied by swelling in the lips.  The patient has been  given multiple medicines in the emergency room including intravenous  steroids, antihistamines and narcotic analgesics.  He still complained  of severe pain and distress in his hands along with severe itching.  The  patient also notes ongoing difficulty swallowing and a sense of fullness  in the throat.  With the angioedema present in his lips and his failure  on outpatient management and this his second visit to the emergency room  in 40 hours I feel he warrants an observation overnight in the hospital  for the purposes of intravenous therapy for  his severe allergic  reaction.   PLAN:  Will admit to monitor cardiac and respiratory monitoring, IV Solu-  Medrol and Benadryl.  Initiate H2 blockade with Zantac.  Will also cover  the patient's anxiety component with lorazepam.  Further orders as noted  chart.      Donna Bernard, M.D.  Electronically Signed     WSL/MEDQ  D:  05/30/2006  T:  05/30/2006  Job:  [id number]

## 2010-11-23 NOTE — Discharge Summary (Signed)
NAME:  [name] [name], [name] [name]                          ACCOUNT NO.:  [id number]   MEDICAL RECORD NO.:  [id number]                   PATIENT TYPE:  INP   LOCATION:  5704                                 FACILITY:  [facility]   PHYSICIAN:  Oley Balm. Sung Amabile, M.D. [facility]          DATE OF BIRTH:  05/12/1971   DATE OF ADMISSION:  02/03/2002  DATE OF DISCHARGE:  02/08/2002                                 DISCHARGE SUMMARY   DISCHARGE DIAGNOSES:  1. Community-acquired pneumonia, no organism specified.  2. Headache.  3  Back pain.   HISTORY OF PRESENT ILLNESS:  The patient is [name], white male,  primary patient of Dr. Gerda Diss, who was transferred to [facility]  after initially being evaluated at [facility] on February 03, 2002.  He presented to the emergency department with acute respiratory  distress and had ruled out for pulmonary embolism.  He did have radiographic  data consistent with pneumonia and was transferred to [facility] to  the care of pulmonary critical care specialist for further evaluation and  treatment.   LABORATORY DATA AND X-RAY FINDINGS:  PTT 44.  WBC 5.4, hemoglobin 14.1,  hematocrit 41.4, platelets 209.  Arterial blood gasses on 50% nonrebreather,  pH 7.35, pCO2 44, pO2 101 with [name] bicarb of 24.  On 40% FIO2, pH 7.39, pCO2  42, pO2 89.  Sodium 134, potassium 3.9, chloride 99, CO2 29, glucose 110,  BUN 8, creatinine 0.9, calcium 8.6.   Chest x-ray shows decrease in bibasilar atelectasis since February 04, 2002.  Please note that this patient is [name] human subject in [name] research trial  activated in adult patient with severe sepsis.  Protocol number [id number]  ([id number]), Shan Levans, M.D.   HOSPITAL COURSE:  1. BILATERAL COMMUNITY ACQUIRED PNEUMONIA WITH NO ORGANISM     SPECIFIED/RESPIRATORY FAILURE:  The patient was admitted to [facility] for bilateral pneumonia as noted on chest x-ray.  He was hypoxic     requiring  supplemental oxygen initially.  He responded to the IV     antimicrobial therapy along with the last trial.  By the day of     discharge, he was off of oxygen.  His O2 saturations rose to 94% on room     air.  His pulmonary status improved to the point he could ambulate     without shortness of breath.  Again, no organism was specified.  He will     be discharged to complete 10 days of antimicrobial therapy initially with     Tequin and then with Rocephin and with Ceftin p.o. b.i.d.   1. HEADACHES:  Headaches were most likely secondary to medications.  These     medications were stopped and his headache improved.   1. BACK PAIN:  For back pain, he was given Ultram.   DISCHARGE MEDICATIONS:  1. Ceftin 250  mg b.i.d. x5 days.  2. Ultram 50 mg one t.i.d. p.r.n.  3. Trial as instructed and this will be followed by the research foundation.   DIET:  Low salt, low fat, low calorie diet.   SPECIAL INSTRUCTIONS:  Call for any problems.   FOLLOW UP:  Follow up for chest x-ray with nurse practitioner Minor on  March 04, 2002, at 2 p.m.   DISPOSITION/CONDITION ON DISCHARGE:  Acute hypoxic respiratory failure has  resolved.  His pulmonary status has improved to the point where he no longer  requires supplemental oxygen.  He is being discharged home in improved  condition.     Stephen Minor, N.P.                       Oley Balm Sung Amabile, M.D. [facility]    SM/MEDQ  D:  02/08/2002  T:  02/08/2002  Job:  [id number]   cc:   Donna Bernard, M.D.  [address]  [place]  [place] [address]  Fax: [phone]   Charlcie Cradle. Delford Field, M.D. [facility]

## 2010-11-23 NOTE — Discharge Summary (Signed)
NAME:  [name] [name], [name] [name]                ACCOUNT NO.:  [id number]   MEDICAL RECORD NO.:  [id number]          PATIENT TYPE:  IPS   LOCATION:  0305                          FACILITY:  [facility]   PHYSICIAN:  Geoffery Lyons, M.D.      DATE OF BIRTH:  1971-02-19   DATE OF ADMISSION:  05/02/2004  DATE OF DISCHARGE:  05/04/2004                                 DISCHARGE SUMMARY   CHIEF COMPLAINT AND PRESENT ILLNESS:  This was the first admission to [facility] for this 40 year old, married, white male,  voluntarily admitted.  History of opiate abuse.  History of knee pain.  Taking hydrocodone to help him until his surgery in October.  Use has been  escalating.  Tried to stop on his own; significant withdrawal.  Feeling very  anxious.  Using few Ativan to control anxiety while getting off the opiates.   PAST PSYCHIATRIC HISTORY:  First time [employer].  No current  treatment.   ALCOHOL/DRUG HISTORY:  Smokes.  No alcohol or other drugs.   PAST MEDICAL HISTORY:  Torn cartilage left knee, surgery April 19, 2004.   MEDICATIONS:  Ativan as needed to come off the opioids.   PHYSICAL EXAMINATION:  Performed and failed to show any acute findings.   LABORATORY WORKUP:  CBC:  Hemoglobin 14.8, white blood cells 12.6.  Blood  chemistry within normal limits.  Liver profile within normal limits.  TSH  1.151.  Drug screening positive for benzodiazepines.   MENTAL STATUS EXAM:  Reveals an alert, cooperative male, pleasant.  Speech  normal rate, tempo and production.  Mood anxious.  Affect anxious.  Thought  processes logical, coherent and relevant.  Focusing on his wanting to detox,  quit taking opioids.  No delusions.  No suicide, no homicide ideas.  No  hallucinations.  Cognition well-preserved.   ADMISSION DIAGNOSES:   AXIS I:  1.  Anxiety disorder, not otherwise specified.  2.  Opiate dependence.   AXIS II:  No diagnosis.   AXIS III:  Knee pain.   AXIS IV:  Moderate.   AXIS  V:  Global Assessment of Functioning upon admission was 35; highest  Global Assessment of Functioning in the last year 70.   COURSE IN HOSPITAL:  He was admitted and started in individual and group  psychotherapy.  He was given Ambien for sleep.  He was detoxified with  clonidine.  He was maintained on Seroquel 50 every six hours as needed and  was started on Celexa 10 mg per day.  Ambien was discontinued.  He was given  trazodone that later was switched to [facility].  He was able to settle down.  He has been able to go through the detox.  Admitted that he was abusing the  pain medications, taking up to 1500 __________ daily.  On October 28th he  was in full contact with reality.  There were no suicide ideas, no homicide  ideas, no hallucinations, no delusions.  Got dependent on the opiates given  to him for his knee pain, but now he felt that he was very  much better.  He  felt that the medication was helping more for the detox.  He said that he  was ready to go.  As he was stable enough, he was endorsing no suicide, no  homicide ideation, we went ahead and discharged to outpatient follow-up.   DISCHARGE DIAGNOSES:   AXIS I:  1.  Anxiety disorder, not otherwise specified.  2.  Opiate dependence.   AXIS II:  No diagnosis.   AXIS III:  Knee pain.   AXIS IV:  Moderate.   AXIS V:  Global Assessment of Functioning upon discharge was 55.   DISCHARGE MEDICATIONS:  1.  Catapres 0.1 one four times a day for a day, then one twice a day for      two days, then one daily for a day.  2.  Celexa 20 mg per day.  3.  Trazodone 50 at bedtime as needed for sleep.   FOLLOW UP:  Leanord Hawking for therapy.     Farrel Gordon   IL/MEDQ  D:  05/30/2004  T:  05/30/2004  Job:  [id number]

## 2010-11-23 NOTE — H&P (Signed)
NAME:  [name] [name], [name] [name]                          ACCOUNT NO.:  [id number]   MEDICAL RECORD NO.:  [id number]                   PATIENT TYPE:  INP   LOCATION:  A309                                 FACILITY:  [facility]   PHYSICIAN:  Francoise Schaumann. Halm, D.O.                DATE OF BIRTH:  07-23-1970   DATE OF ADMISSION:  02/13/2002  DATE OF DISCHARGE:                                HISTORY & PHYSICAL   CHIEF COMPLAINT:  Chest pain.   BRIEF HISTORY:  The patient is [name] man who presents to the  emergency department with [name] two-day history of progressive pleuritic-type  chest pain mainly on his right side.  The patient has [name] recent medical  history of being in the hospital at [facility] over the last two weeks for  suspected pneumococcal pneumonia.  Two weeks ago, he presented to [facility]  Emergency Room with acute-onset respiratory distress.  His workup concluded  that there was no PE evident but he did have significant bilobar pneumonia  in his lower lung lobes noted on spiral CT.  He was admitted to [facility] ICU and placed on an antibiotic regimen.  Over the course of the  following week, he improved and was discharged.  Two days later, he was  readmitted to the hospital due to some continued fevers and double vision.  He was recently discharged two days ago.  At that time, he had some right-  sided pleuritic chest discomfort but this has progressed over the last two  days.   In the emergency room, the patient was noted to be tachypneic, in mild  respiratory distress, with evidence of hypoxia; he is admitted to the  hospital for further management.   PAST MEDICAL HISTORY:  As noted.  Previous to this, he has been very healthy  and has had no surgeries.   SOCIAL HISTORY:  The patient smokes occasionally but not even daily.  He has  no significant alcohol intake.  He has no occupational or environmental  exposures to toxins.   MEDICATIONS:  He has been on Tequin 400 mg daily.   ALLERGIES:  MORPHINE.   FAMILY HISTORY:  Family history is significant for some family members with  viral-type illnesses recently.  Otherwise, noncontributory.   REVIEW OF SYSTEMS:  The patient has had no fever in the last two days.  He  denies any significant cough throughout the last two weeks.  He has had no  sputum production.  He has had some headache noted earlier in the week.  He  has had no skin rashes or joint swelling.  He does admit to his testicles  being swollen and tender over the last couple of days.  He has had no  dysuria or GI symptoms.   PHYSICAL EXAMINATION:  VITAL SIGNS:  Initially in the emergency room, the  patient had [name] temperature of 98.8, pulse  of 116, respirations of 40 and  blood pressure of 149/85.  His O2 saturation was 97% on room air.  He seemed  to be more comfortable now on oxygen at 2 l/min.  GENERAL:  He is somewhat overweight but otherwise well-nourished and healthy  appearing.  He is medicated at the time of my interview and exam and is  somewhat slow and lethargic in his psychomotor responses.  He is alert and  oriented x3.  HEAD AND NECK:  Unremarkable.  He has no carotid bruit.  He has good carotid  upstrokes.  HEART:  Regular with no ectopy or murmur.  LUNGS:  Clear in all fields with no wheezing or crackles.  ABDOMEN:  Soft and nontender.  EXTREMITIES:  No edema.  He has no joint swelling.  NEUROLOGIC:  Exam is nonlateralizing with symmetric tendon reflexes.   LABORATORY STUDIES:  His D-dimer was elevated at 1.0.  His ABG shows [name] pH of  7.42, PCO2 of 35, PO2 of 73, consistent with moderate arterial and alveolar  mismatch.  His WBC is 14,700 with 79% neutrophils.  His hemoglobin is  normal.  Platelets are normal.   CT of the chest shows no evidence of pulmonary emboli.  He does have  evidence of bilateral dependent atelectasis in his lung bases.   IMPRESSION AND PLAN:  1. Status post pneumococcal pneumonia.  2. Bilateral atelectasis  causing his current hypoxia.  3. Pleuritic-type chest pain.   Plan will be to admit and watch him closely.  He will be placed on oxygen  and will be provided incentive spirometry and possibly pulmonary rehab as an  outpatient.  We will allow him to rest most of today and provide analgesia  and push his pulmonary status tomorrow with more exercise.   I have reviewed the care plan with the patient and his family and they are  in agreement.                                               Francoise Schaumann. Milford Cage, D.O.    [facility]  D:  02/13/2002  T:  02/15/2002  Job:  [id number]   cc:   Donna Bernard, M.D.  [address]  [place]  [place] [address]  Fax: [phone]

## 2010-11-23 NOTE — Consult Note (Signed)
NAME:  [name] [name], [name] [name]                          ACCOUNT NO.:  [id number]   MEDICAL RECORD NO.:  [id number]                   PATIENT TYPE:  INP   LOCATION:  5740                                 FACILITY:  [facility]   PHYSICIAN:  Deanna Artis. Sharene Skeans, M.D.           DATE OF BIRTH:  Dec 31, 1970   DATE OF CONSULTATION:  02/10/2002  DATE OF DISCHARGE:  02/11/2002                              NEUROLOGY CONSULTATION   CHIEF COMPLAINT:  Diplopia.   HISTORY OF THE PRESENT CONDITION:  I was asked by Dr. Sung Amabile to see the  patient in consultation to evaluate diplopia which appeared in the setting  of [name] community-acquired pneumonia.  The patient had been admitted to [facility] with severe respiratory distress in transfer from [facility]  on February 03, 2002.  He was evaluated for [name] pulmonary embolism, which was  ruled out, and placed under the care of [name] critical care specialist.   He required supplemental oxygen and was treated with antibiotics with steady  improvement in his condition over the six-day hospitalization.  The patient  was discharged to complete an outpatient antibiotic therapy.  The patient  complained of headaches which went away when medications were stopped.  He  presented the next day acutely ill with fever (temperature to 101.3),  general malaise, and diplopia.  He was admitted for further evaluation.  CT  scan of the brain carried out February 09, 2002 was normal.  CT scan of the  maxillofacial area was also normal, showing no sinusitis, no evidence of  abnormality within the orbits, including the muscles of the eye.  He  continued today to contain of diplopia that was most prominent when he laid  down on his back and looked up at the T.V.  This appeared to be horizontal  diplopia, was intermittent and went away for no particular reason.  As [name]  result of this, I was asked to see the patient to determine the etiology of  his dysfunction, to review his CT scan, and to make  recommendations for  further workup and treatment.   PAST MEDICAL HISTORY:  Negative except for pneumonia described above.   REVIEW OF SYSTEMS:  The patient no longer complains of fever.  He has some  soreness in his arms related to the IV sites and venipuncture.  He complains  that his left scrotal sac is larger than the right, that his testes are  tender, and that his penis seems smaller than it was before he was  catheterized.   The patient does not have other complaints referable to the head and neck,  lungs, GI, GU.  No rash, anemia, bruisability, diabetes, or thyroid disease.  No other neurologic complaints including dysarthria, dysphagia, ptosis.  He  complained of some difficulty swallowing and said that things seemed as if  they got caught in his throat but did not choke, nor did he have change  in  the tone or quality of his voice.  No facial weakness or numbness.  No axial  weakness or numbness.  No loss of bowel or bladder control, syncope,  seizures, tinnitus, vertigo, unsteady gait.  No seizures.   [name] 12-system review is otherwise unremarkable.   CURRENT MEDICATIONS:  1. Tequin 400 mg q.d.  2. Phenergan 25 mg q.4h. p.r.n. p.o. or IV.  3. Tylenol 650 mg q.4h. p.r.n.   ALLERGIES TO MEDICINES:  None.   INTOLERANCES:  MORPHINE (itching).   PAST SURGICAL HISTORY:  Left knee anterior cruciate ligament reconstruction.   FAMILY HISTORY:  Mother has Crohn's disease and is 50.  Father is in good  health except for intermittent gout, age 73 . Siblings are in good health.   SOCIAL HISTORY:  The patient is married with two children ages 2 and two.  He works at [employer].  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  He has remote tobacco use, five cigarettes per day for four  years, and drinks one to two beers per day.  He denies use of illicit drugs.   PHYSICAL EXAMINATION TODAY:   GENERAL:  This is [name] well-developed, well-  nourished, large, right-handed gentleman in no acute distress.  VITAL SIGNS:  Blood pressure 132/78, resting pulse 96, respirations 20,  temperature 98.6.  HEENT:  No signs of infection.  Supple neck, full range of motion.  No  cranial or cervical bruits, no scleral icterus.  LUNGS:  Clear to auscultation.  HEART:  No murmurs, pulses normal.  ABDOMEN:  Soft, nontender.  Bowel sounds normal.  No hepatosplenomegaly.  EXTREMITIES:  Well formed.  GENITOURINARY:  The patient's left scrotal sac is [name] little bit lower, testes  are somewhat tender.  There is no tenderness in the epididymis.  The penis  appears to be normal.  NEUROLOGIC:  Mental status:  Awake, alert, attentive, appropriate.  No  dysphagia or dyspraxia.  He is slightly anxious.  Cranial nerves:  Round  reactive pupils, normal fundi, full visual fields to double simultaneous  stimuli.  Extraocular movements full and conjugate.  OKN responses equal  bilaterally.  Symmetric facial strength and sensation.  Air conduction  greater than bone conduction bilaterally.  I had the patient use [name] red lens.  He had horizontal diplopia in left inferior gaze, vertical diplopia in  midline superior gaze, and horizontal diplopia in right superior gaze.  He  had monocular vision in all other excursions.  When I did the procedure [name]  second time he had monocular vision in all positions tested.   Motor examination:  Normal strength, tone, and mass.  Good fine motor  movements, no pronator drift.  Sensation intact to cold, vibration, and  stereoagnosis.  Cerebella examination:  Good finger-to-nose, rapid  repetitive movements.  No tremor, dyspraxia, or dysmetria.  Gait and station  were normal.  He could walk on his heels and his toes, perform [name] tandem  without difficulty.  Romberg response was negative.   IMPRESSION:  Diplopia, unknown etiology, 368.2.  Differential diagnosis would include conditions like  myasthenia gravis, thyroid disease (especially  Graves disease with thyrotoxicosis), Lambert-Eaton syndrome, and other  conditions causing infiltration of the muscles or pseudotumor of the orbits.   These are largely ruled out by the CT scan of the orbits and also the CT  scan of the brain.  At present time I do not  think that further neurologic  evaluation is necessary.  If symptoms of diplopia persist I would perform [name]  Tensilon test and acetylcholine receptor antibody panel to evaluate him for  myasthenia gravis, but I think that this is highly unlikely.  The patient  seems, to me, quite anxious.  He seemed reassured when I told him that the  brain and the orbits were normal.   I appreciate the opportunity to see him.  If you have questions or I can be  of assistance, do not hesitate to contact me.                                                Deanna Artis. Sharene Skeans, M.D.    [facility]  D:  02/10/2002  T:  02/15/2002  Job:  [id number]   cc:   Oley Balm. Sung Amabile, M.D. [facility]   Donna Bernard, M.D.  [address]  [place]  [place] [address]  Fax: [phone]

## 2010-11-23 NOTE — H&P (Signed)
NAME:  [name] [name], [name] [name]                          ACCOUNT NO.:  [id number]   MEDICAL RECORD NO.:  [id number]                   PATIENT TYPE:  INP   LOCATION:  5740                                 FACILITY:  [facility]   PHYSICIAN:  Casimiro Needle B. Sherene Sires, M.D. [facility]           DATE OF BIRTH:  Aug 25, 1970   DATE OF ADMISSION:  02/09/2002  DATE OF DISCHARGE:  02/11/2002                                HISTORY & PHYSICAL   CHIEF COMPLAINT:  1. Failure to thrive.  2. Diplopia, i.e. double vision.  3. Risk of community acquired pneumonia.  4. Febrile.   HISTORY OF PRESENT ILLNESS:  [name] [name] is [name] white male  admitted to [facility] on 02/03/2002 with community acquired  pneumonia and no organism specified.  He was discharged on 02/08/2002 with  improvement in his hypoxia and his general malaise.  He presents today as  acute sick call to see Dr. Sherene Sires.  Chest x-ray is unremarkable, but he is  febrile with [name] fever of 101.3, he has general malaise, and now with double  vision i.e. diplopia.  He was on the research trial called ADDRESS trial,  which is [name] double blind randomized study of Xigris versus placebo.  Due to  this continued fever despite being on antimicrobial therapy with failure to  thrive, and new neurological symptoms of double vision, will be admitted for  further evaluation and treatment.   PAST MEDICAL HISTORY:  1. Recent bilateral lower lobe pneumonia.  2. Otherwise negative.   ALLERGIES:  MORPHINE, questionable ROCEPHIN which caused nausea and  vomiting.   MEDICATIONS:  Rocephin 250 mg b.i.d. p.r.n., Ultram 50 mg t.i.d.   REVIEW OF SYSTEMS:  Negative, except noted on HPI.   SOCIAL HISTORY:  Occasional tobacco, occasional alcohol.   FAMILY HISTORY:  Noncontributory.  See HPI dated 02/03/2002.   PHYSICAL EXAMINATION:  GENERAL:  Well-nourished, well-developed white male who appears toxic.   HEENT:  No JVD is noted.  Oral and nasopharynx is unremarkable.   CHEST:  Lungs clear to auscultation.   CARDIAC:  Heart sounds are regular, rate, and rhythm.   ABDOMEN:  Soft, nontender, positive bowel sounds.   GU:  Voids.   EXTREMITIES:  Without edema and warm.   IMPRESSION/PLAN:  1. Febrile illness with questionable sinusitis versus brain abscess versus     new neurological dysfunction such as Guillain-Barre syndrome, therefore,     [name]. Will admit him to the hospital.     b. CT of the head and sinuses.     c. IV antibiotics.     d. Blood cultures.  2. ADDRESS trial candidate which is completed.     Katy Apo Sherene Sires, M.D. [facility]    [facility]/MEDQ  D:  02/09/2002  T:  02/13/2002  Job:  [id number]

## 2010-11-23 NOTE — H&P (Signed)
NAME:  [name] [name] [name], [name] [name] [name]                          ACCOUNT NO.:  [id number]   MEDICAL RECORD NO.:  [id number]                   PATIENT TYPE:  INP   LOCATION:  5704                                 FACILITY:  [facility]   PHYSICIAN:  Oley Balm. Sung Amabile, M.D. [facility]          DATE OF BIRTH:  October 11, 1970   DATE OF ADMISSION:  02/03/2002  DATE OF DISCHARGE:                                HISTORY & PHYSICAL   ADMISSION DIAGNOSIS:  Respiratory failure secondary to bilateral lower lobe  pneumonia.   HISTORY OF PRESENT ILLNESS:  The patient is [name] [name] gentleman,  previously healthy with no major medical problems and no major prior  surgeries or hospitalizations.  He was awakened on the morning of admission  with severe shortness fo breath and bilateral flank pain.  He presented to  the emergency department at [facility] in respiratory distress with  hypoxemia.  Their initial concern was for pulmonary embolism.  CT scan of  the chest did not confirm this diagnosis.  It did demonstrate bilateral  lower lobe infiltrates.  The patient was transferred here for further  evaluation and management.  At present, he is alert and awake.  He denies  documented fever, cough, or sputum production.  He was in his usual state of  health on the day prior to this admission.  He does have several family  members who are sick with [name] [name] viral-type illness.   PAST MEDICAL HISTORY:  Negative.   SOCIAL HISTORY:  He smokes occasionally.  He does not smoke on [name] [name] daily  basis.  He does not use alcohol on [name] [name] regular basis.  He has no history of  significant occupational or environmental exposures.   FAMILY HISTORY:  Noncontributory.   REVIEW OF SYSTEMS:  As per the history of present illness.  In addition, he  denies sinus symptoms.  No neurologic symptoms.  No anginal chest pain.  No  nausea, vomiting, diarrhea, or dysuria.  No lower extremity edema or calf  tenderness.   PHYSICAL EXAMINATION:  VITAL SIGNS:   Temperature 99.5 degrees, blood  pressure 135/80, pulse 105, respirations 25 and shallow, oxygen saturation  100% on [name] [name] 100% nonrebreather.  GENERAL APPEARANCE:  He is diaphoretic and appears in discomfort.  HEENT:  No acute abnormalities.  NECK:  Supple without abnormality or jugular venous distention.  CHEST:  Dullness to percussion in both bases.  He has bronchial breath  sounds in both lower lobes.  CARDIAC:  Tachycardia  with [name] [name] regular rhythm and no murmurs.  ABDOMEN:  Mildly obese, soft, and nontender with normoactive bowel sounds.  EXTREMITIES:  Without cyanosis, clubbing, or edema.  There is [name] [name] well-healed  surgical scar over his left knee from prior knee surgery eight years ago.  NEUROLOGIC:  No focal deficits.   LABORATORY DATA:  CT scan of the chest reveals consolidation of both lower  lobes consistent with pneumonia.  No evidence  of pulmonary embolism is seen.   Chemistries are normal.  The CBC is normal, except [name] [name] white blood cell count  of 15,900.  D-dimer is less than 0.50.  An arterial blood gas was performed  at [facility] and reveals [name] [name] pH of 7.33 with [name] [name] pCO2 of 44 and [name] [name] pO2  of 91 on [name] [name] 100% nonrebreather.   EKG reveals sinus tachycardia and otherwise no acute changes.   IMPRESSION AND PLAN:  Acute respiratory failure secondary to bilateral lower  lobe pneumonia.  The acuteness of onset in this setting makes this almost  certainly Pneumonococcus.  Blood cultures have already been drawn.  We will  obtain sputum cultures.  He will be covered with ceftriaxone at 2 g [name] [name] day  and Tequin to cover atypicals.  He needs effective analgesia and morphine  will be given.  He will be considered for the low-risk Zygis trial.  Routine  DVT prophylaxis will be provided.  The rest of his management will be  dictated by his hospital course.                                               Oley Balm Sung Amabile, M.D. [facility]    DBS/MEDQ  D:  02/03/2002  T:  02/08/2002  Job:  [id number]    cc:   Lorin Picket [name] [name]. Gerda Diss, M.D.

## 2010-11-23 NOTE — Procedures (Signed)
   NAME:  Hanigan, Arvie A                          ACCOUNT NO.:  [id number]   MEDICAL RECORD NO.:  [id number]                   PATIENT TYPE:  INP   LOCATION:  5704                                 FACILITY:  [facility]   PHYSICIAN:  Fredirick Maudlin, M.D.              DATE OF BIRTH:  1970/12/30   DATE OF PROCEDURE:  02/03/2002  DATE OF DISCHARGE:  02/08/2002                                EKG INTERPRETATION   ELECTROCARDIOGRAM:  [id number]   INTERPRETATION:  The rhythm is a sinus rhythm with a tachycardia rate of  about 115.  The axis was rightward.                                               Fredirick Maudlin, M.D.    [facility]  D:  02/03/2002  T:  02/09/2002  Job:  [id number]

## 2010-11-23 NOTE — Discharge Summary (Signed)
NAME:  [name] [name] [name], [name] [name] [name]                          ACCOUNT NO.:  [id number]   MEDICAL RECORD NO.:  [id number]                   PATIENT TYPE:  INP   LOCATION:  5740                                 FACILITY:  [facility]   PHYSICIAN:  Oley Balm. Sung Amabile, M.D. [facility]          DATE OF BIRTH:  1970-08-18   DATE OF ADMISSION:  02/09/2002  DATE OF DISCHARGE:  02/11/2002                                 DISCHARGE SUMMARY   ADMISSION DIAGNOSES:  1. Resolving pneumonia.  2. Recurrent fever.  3. Diplopia.   DISCHARGE DIAGNOSES:  1. Resolving pneumonia.  2. Recurrent fever.  3. Diplopia.  4. Scrotal tenderness.  5. Constipation.   HISTORY OF PRESENT ILLNESS:  Please refer to the recent discharge summary  related to [name] [name] [name] hospitalization from February 03, 2002, to February 08, 2002, as  well as the admission history and physical for the details of this patients  presentation.  Briefly, he is [name] [name] [name] year old gentleman who was transferred to  [facility] on February 03, 2002, with bilateral lower lobe pneumonia  presenting with shortness of breath and pleuritic chest pain.  He was  treated in the intensive care unit with antibiotics and was enrolled in the  Address Trial looking at the use of Xigris and low-risk sepsis.  The patient  did well and had an uncomplicated hospitalization.  He was discharged to  home on February 08, 2002, with Ceftin as [name] [name] [name] outpatient antibiotic.  He was  seen in the office on February 09, 2002, as [name] [name].  [name] [name] [name] was  fever and diplopia.  [name] [name] [name] chest x-ray revealed minimal bilateral atelectasis.  Previously it had demonstrated consolidation in both lower lobes.  Noting  that he had been in the Address Trial looking at the role of Xigris in this  setting, and noting that Xigris is an anticoagulant, Dr. Sherene Sires felt that the  patient should be admitted for further evaluation.   HOSPITAL COURSE:  This was uncomplicated.  He was treated with intravenous  Tequin.  He defervesced  immediately and remained afebrile through the rest  of [name] [name] [name] hospitalization.  He underwent [name] [name] CT scan of the head which was  entirely normal.  He was seen by neurology for the diplopia and they felt  that this was [name] [name] transient phenomenon which did not require further  evaluation.  On the day of discharge, the patient is complaining of left  testicular and scrotal pain.  On examination, there are no palpable masses  or areas of fluctuation.  There is no erythema.  He is mildly tender on  examination, however.  It is my feeling that this might represent an  epididymitis and he should be covered with the antibiotic prescribed on  discharge.  Also upon discharge he is complaining of constipation.  It is  noted that he has been taking opiate analgesics over the past several days  for pleuritic chest pain related  to the pneumonia.   DISCHARGE MEDICATIONS:  1. Tequin 400 mg p.o. q.d. x5 more days.  2. Percocet one to two p.o. q.4-6h. p.r.n. pleuritic pain.  3. Over-the-counter laxative for constipation.    DISCHARGE INSTRUCTIONS:  He is to follow up with Dr. Gerda Diss in one to two  weeks.  He already has follow-up arranged with our practice.  If the  testicular and scrotal pain does not improve, he is to contact Dr. Gerda Diss.                                               Oley Balm Sung Amabile, M.D. [facility]    DBS/MEDQ  D:  02/11/2002  T:  02/16/2002  Job:  [id number]   cc:   [name] [name] [name]. Gerda Diss, M.D.

## 2010-11-23 NOTE — Discharge Summary (Signed)
NAME:  [name], [name]                ACCOUNT NO.:  [id number]   MEDICAL RECORD NO.:  [id number]           PATIENT TYPE:   LOCATION:                                 FACILITY:   PHYSICIAN:  Donna Bernard, M.D.DATE OF BIRTH:  26-Jan-1971   DATE OF ADMISSION:  DATE OF DISCHARGE:  11/23/2007LH                               DISCHARGE SUMMARY   FINAL DIAGNOSIS:  Allergic reaction with angioedema.   PLAN AND DISPOSITION:  1. Patient discharged home.  2. Patient to avoid Ambien in the future.   DISCHARGE MEDICATIONS:  1. Maintain chronic medications.  2. Effexor 75 XR daily with as needed lorazepam as prescribed per Dr.      Omelia Blackwater.  3. Prednisone taper as directed.  4. Zantac 300 mg nightly for 15 days.  5. Benadryl by mouth as needed for itching.   Follow-up in the office as needed.   HISTORY AND PHYSICAL:  See H&P as dictated.   HOSPITAL COURSE:  This patient is a 40 year old white male with history  of chronic anxiety who had a severe allergic reaction to his first dose  of Ambien.  This put him in the emergency room for initial therapy.  He  represented in 36 hours with swelling in his lips and a sensation of  trouble swallowing.  Despite aggressive therapy in the ER the patient  continued to have significant swelling and noted ongoing difficulty with  swallowing.  In addition he had an urticarial rash which was both  pruritic and very painful.  Due to all his symptoms and his ongoing  serious immune response we admitted him for observation.  The patient  was given IV steroids, IV antihistamines, H2 blockade.  Patient was also  given his chronic medicines.  Over the next 20 hours he improved  markedly.  This morning his swelling is down.  His urticarial rash is  gone.  He no longer has sensation of difficulty swallowing.  Patient  advised to return to [place] at once if these are to recur.  Patient is  discharged home.   DIAGNOSIS AND DISPOSITION:  As noted above.      Donna Bernard, M.D.  Electronically Signed    WSL/MEDQ  D:  05/30/2006  T:  05/30/2006  Job:  [id number]

## 2010-11-23 NOTE — Discharge Summary (Signed)
   NAME:  [name] [name], [name] [name]                            ACCOUNT NO.:  [id number]   MEDICAL RECORD NO.:  [id number]                  PATIENT TYPE:   LOCATION:                                       FACILITY:   PHYSICIAN:  Donna Bernard, M.D.             DATE OF BIRTH:   DATE OF ADMISSION:  DATE OF DISCHARGE:  02/16/2002                                 DISCHARGE SUMMARY   FINAL DIAGNOSES:  1. Pleuritic chest pain.  2. Anxiety.  3. Status post recent pneumonia with near respiratory failure.   FINAL DISPOSITION:  Patient discharged to home.   DISCHARGE MEDICATIONS:  1. Tylox one four times per day.  2. Ativan as directed.  3. Paxil 20 mg daily as directed.  4. Levaquin 500 mg daily for the next seven days.   FOLLOW UP:  Follow up in the office in one week.   HISTORY OF PRESENT ILLNESS:  See H&P as dictated.   HOSPITAL COURSE:  This patient is a 40 year old man with a history of recent  admission to the hospital for probable pneumococcal pneumonia at that time  where he presented in acute respiratory distress. He was admitted to [facility]  ICU and placed on antibiotics for the next week. He improved. He had bounced  back to the emergency room before readmission with complaints of chest pain.  The day of admission he presented with complaints of sharp chest pain, worse  with a deep breath. He had a CT scan of the chest that showed no pulmonary  emboli, just slight atelectasis. PCO2 was 35.0, PO2 73.0, D-dimer 1.0. The  patient was admitted to the hospital. His hypoxia was felt to be due  atelectasis present both on CT scan and chest x-ray. He was given  supplemental oxygen. He was given pain medicines via IV, Ativan 1 mg t.i.d.  was administered as needed for anxiety. The patient was given IV fluids.  Telemetry was initially proceeded, but this was stopped after the patient  continued to improve.   On the day of discharge the patient was feeling better, but he was still  having chest  discomfort and he was still having anxiety but both of these  were felt to be at a level which would allow for home management. The  patient was discharged home with diagnoses and disposition as noted above.                                                Donna Bernard, M.D.    Karie Chimera  D:  04/07/2002  T:  04/09/2002  Job:  [id number]

## 2011-04-01 LAB — URINALYSIS, ROUTINE W REFLEX MICROSCOPIC
Hgb urine dipstick: NEGATIVE
Nitrite: NEGATIVE
Specific Gravity, Urine: >1.03 — ABNORMAL HIGH
Urobilinogen, UA: 0.2

## 2011-04-01 LAB — D-DIMER, QUANTITATIVE: D-Dimer, Quant: 0.34

## 2011-04-19 ENCOUNTER — Emergency Department (HOSPITAL_COMMUNITY): Payer: Self-pay

## 2011-04-19 ENCOUNTER — Encounter: Payer: Self-pay | Admitting: *Deleted

## 2011-04-19 ENCOUNTER — Emergency Department (HOSPITAL_COMMUNITY)
Admission: EM | Admit: 2011-04-19 | Discharge: 2011-04-19 | Disposition: A | Discharge status: Home / Self Care | Payer: Self-pay | Attending: Emergency Medicine | Admitting: Emergency Medicine

## 2011-04-19 DIAGNOSIS — Z87891 Personal history of nicotine dependence: Secondary | ICD-10-CM | POA: Insufficient documentation

## 2011-04-19 DIAGNOSIS — X500XXA Overexertion from strenuous movement or load, initial encounter: Secondary | ICD-10-CM | POA: Insufficient documentation

## 2011-04-19 DIAGNOSIS — M25519 Pain in unspecified shoulder: Secondary | ICD-10-CM | POA: Insufficient documentation

## 2011-04-19 MED ORDER — IBUPROFEN 800 MG PO TABS: 800.0000 mg | ORAL_TABLET | Freq: Once | ORAL | Status: DC

## 2011-04-19 MED ORDER — HYDROCODONE-ACETAMINOPHEN 5-325 MG PO TABS: 1.0000 | ORAL_TABLET | ORAL | Status: AC | PRN

## 2011-04-19 MED ORDER — IBUPROFEN 800 MG PO TABS: 800.0000 mg | ORAL_TABLET | Freq: Three times a day (TID) | ORAL | Status: AC

## 2011-04-19 NOTE — ED Notes (Signed)
Pt left er stating no needs 

## 2011-04-19 NOTE — ED Provider Notes (Signed)
History     CSN: [id number] Arrival date & time: 04/19/2011  3:11 AM  Chief Complaint  Patient presents with  . Shoulder Pain    (Consider location/radiation/quality/duration/timing/severity/associated sxs/prior treatment) HPI Comments: Seen at 0358. Patient states that pain to his right shoulder began about 4 days ago and hurt when he move his arm or when he carried heavy items. Tonight he went to pick up a can of paint and hard a "pop". Has been unable to pick up his arm since. He has taken no medicines and there is nothing he has done that improved the pain. Movement and attempts to lift the arm increase the pain. He denies numbness and tingling to the fingers or hand.   Patient is a 39 y.o. male presenting with shoulder pain.  Shoulder Pain    History reviewed. No pertinent past medical history.  Past Surgical History  Procedure Date  . Cholecystectomy   . Knee surgery     History reviewed. No pertinent family history.  History  Substance Use Topics  . Smoking status: Former Games developer  . Smokeless tobacco: Not on file  . Alcohol Use: No      Review of Systems  Musculoskeletal:       Right shoulder pain  All other systems reviewed and are negative.    Allergies  Ambien  Home Medications  No current outpatient prescriptions on file.  BP 124/85  Pulse 109  Temp(Src) 98.8 F (37.1 C) (Oral)  Resp 18  Ht 6' (1.829 m)  Wt 250 lb (113.399 kg)  BMI 33.91 kg/m2  SpO2 98%  Physical Exam  Nursing note and vitals reviewed. Constitutional: He is oriented to person, place, and time. He appears well-developed and well-nourished.  HENT:  Head: Normocephalic and atraumatic.  Eyes: EOM are normal.  Neck: Normal range of motion. Neck supple.  Cardiovascular: Normal rate, normal heart sounds and intact distal pulses.   Pulmonary/Chest: Effort normal and breath sounds normal.  Abdominal: Soft. Bowel sounds are normal.  Musculoskeletal:       Right shulder with  discomfort to palpation. Unable to shrug shoulder without pain. Unable to lift arm without pain. If shoulder is help immobile, ROM at elbow and wrist is normal. Unable to perforn ROM on shoulder due to pain. Cap refill brisk, pulses 2+  Neurological: He is alert and oriented to person, place, and time.  Skin: Skin is warm and dry.    ED Course  Procedures (including critical care time)  .Dg Shoulder Right  04/19/2011  *RADIOLOGY REPORT*  Clinical Data: Right shoulder pain  RIGHT SHOULDER - 2+ VIEW  Comparison: None.  Findings: No displaced acute fracture or dislocation identified. No aggressive appearing osseous lesion.  Clear right upper lung.  IMPRESSION: No acute osseous abnormality.  Original Report Authenticated By: Waneta Martins, M.D.     Patient with shoulder pain for several days with acute injury tonight. PE significant for signs of rotator cuff tear. Plain films unremarkable. Patient placed in sling and referred to orthopedist for follow up.            Nicoletta Dress. Colon Branch, MD 04/19/11 [phone]

## 2011-04-19 NOTE — ED Notes (Signed)
Rt shoulder and arm pain for 4 days.  Worse tonight when picked up bucket of paint, heard a  Pop.

## 2011-08-07 ENCOUNTER — Emergency Department (HOSPITAL_COMMUNITY): Payer: Self-pay

## 2011-08-07 ENCOUNTER — Encounter (HOSPITAL_COMMUNITY): Payer: Self-pay

## 2011-08-07 ENCOUNTER — Emergency Department (HOSPITAL_COMMUNITY)
Admission: EM | Admit: 2011-08-07 | Discharge: 2011-08-07 | Disposition: A | Discharge status: Home / Self Care | Payer: Self-pay | Attending: Emergency Medicine | Admitting: Emergency Medicine

## 2011-08-07 DIAGNOSIS — J159 Unspecified bacterial pneumonia: Secondary | ICD-10-CM

## 2011-08-07 DIAGNOSIS — J189 Pneumonia, unspecified organism: Secondary | ICD-10-CM | POA: Insufficient documentation

## 2011-08-07 DIAGNOSIS — Z87891 Personal history of nicotine dependence: Secondary | ICD-10-CM | POA: Insufficient documentation

## 2011-08-07 MED ORDER — ALBUTEROL SULFATE HFA 108 (90 BASE) MCG/ACT IN AERS: 1.0000 | INHALATION_SPRAY | Freq: Four times a day (QID) | RESPIRATORY_TRACT | Status: DC | PRN

## 2011-08-07 MED ORDER — CEFTRIAXONE SODIUM 250 MG IJ SOLR: 250.0000 mg | Freq: Once | INTRAMUSCULAR | Status: DC

## 2011-08-07 MED ORDER — GUAIFENESIN-CODEINE 100-10 MG/5ML PO SYRP: ORAL_SOLUTION | ORAL | Status: DC

## 2011-08-07 MED ORDER — AZITHROMYCIN 250 MG PO TABS: ORAL_TABLET | ORAL | Status: DC

## 2011-08-07 MED ORDER — AZITHROMYCIN 250 MG PO TABS
500.0000 mg | ORAL_TABLET | Freq: Once | ORAL | Status: AC
  Administered 2011-08-07: 500 mg via ORAL
  Filled 2011-08-07: qty 2

## 2011-08-07 MED ORDER — IPRATROPIUM BROMIDE 0.02 % IN SOLN
0.5000 mg | Freq: Once | RESPIRATORY_TRACT | Status: AC
  Administered 2011-08-07: 0.5 mg via RESPIRATORY_TRACT
  Filled 2011-08-07: qty 2.5

## 2011-08-07 MED ORDER — CEFTRIAXONE SODIUM 1 G IJ SOLR
1.0000 g | Freq: Once | INTRAMUSCULAR | Status: AC
  Administered 2011-08-07: 1 g via INTRAMUSCULAR
  Filled 2011-08-07: qty 10

## 2011-08-07 MED ORDER — ALBUTEROL SULFATE (5 MG/ML) 0.5% IN NEBU
5.0000 mg | INHALATION_SOLUTION | Freq: Once | RESPIRATORY_TRACT | Status: AC
  Administered 2011-08-07: 5 mg via RESPIRATORY_TRACT
  Filled 2011-08-07: qty 1

## 2011-08-07 MED ORDER — PREDNISONE 20 MG PO TABS
60.0000 mg | ORAL_TABLET | Freq: Once | ORAL | Status: AC
  Administered 2011-08-07: 60 mg via ORAL
  Filled 2011-08-07: qty 3

## 2011-08-07 MED ORDER — PREDNISONE 50 MG PO TABS: ORAL_TABLET | ORAL | Status: AC

## 2011-08-07 MED ORDER — LIDOCAINE HCL (PF) 1 % IJ SOLN
INTRAMUSCULAR | Status: AC
  Administered 2011-08-07: 2.1 mL
  Filled 2011-08-07: qty 5

## 2011-08-07 NOTE — ED Notes (Signed)
Pt reports productive cough, congestion, and headache x 2 weeks.

## 2011-08-07 NOTE — ED Provider Notes (Signed)
History     CSN: [id number]  Arrival date & time 08/07/11  4098   First MD Initiated Contact with Patient 08/07/11 0805      Chief Complaint  Patient presents with  . URI    (Consider location/radiation/quality/duration/timing/severity/associated sxs/prior treatment) HPI Comments: No chest pain.  No hemoptysis  Patient is a 41 y.o. male presenting with URI. The history is provided by the patient. No language interpreter was used.  URI The primary symptoms include headaches and cough. Primary symptoms do not include fever, wheezing, nausea or vomiting. Episode onset: cough prod of white sputum x 2 weeks.  awakened today unable to "catch my breath". This is a new problem.  The illness is not associated with chills. The following treatments were addressed: A decongestant was not tried.    History reviewed. No pertinent past medical history.  Past Surgical History  Procedure Date  . Cholecystectomy   . Knee surgery     No family history on file.  History  Substance Use Topics  . Smoking status: Former Games developer  . Smokeless tobacco: Not on file  . Alcohol Use: No      Review of Systems  Constitutional: Negative for fever and chills.  Respiratory: Positive for cough and shortness of breath. Negative for wheezing.   Cardiovascular: Negative for chest pain.  Gastrointestinal: Negative for nausea and vomiting.  Neurological: Positive for headaches.  All other systems reviewed and are negative.    Allergies  Ambien  Home Medications  No current outpatient prescriptions on file.  Pulse 114  Temp(Src) 99 F (37.2 C) (Oral)  Resp 24  Ht 6' (1.829 m)  Wt 240 lb (108.863 kg)  BMI 32.55 kg/m2  SpO2 96%  Physical Exam  Nursing note and vitals reviewed. Constitutional: He is oriented to person, place, and time. He appears well-developed and well-nourished. No distress.  HENT:  Head: Normocephalic and atraumatic.  Eyes: EOM are normal.  Neck: Normal range of  motion.  Cardiovascular: Normal rate, regular rhythm, normal heart sounds and intact distal pulses.  Exam reveals no gallop and no friction rub.   No murmur heard. Pulmonary/Chest: Effort normal. No accessory muscle usage. No respiratory distress. He has no decreased breath sounds. He has no wheezes. He has rhonchi in the right lower field and the left lower field. He has no rales.       sats fluctuating between 85-96% on RA.  Abdominal: Soft. He exhibits no distension. There is no tenderness.  Musculoskeletal: Normal range of motion.  Neurological: He is alert and oriented to person, place, and time.  Skin: Skin is warm and dry.  Psychiatric: He has a normal mood and affect. Judgment normal.    ED Course  Procedures (including critical care time)  Labs Reviewed - No data to display No results found.   No diagnosis found.    MDM   0940-spoke with pt regarding x-ray results.  HR-108 and SO2=92-93% on RA.       Worthy Rancher, PA 08/07/11 0939  Worthy Rancher, PA 08/07/11 [phone]

## 2011-08-07 NOTE — ED Provider Notes (Signed)
Medical screening examination/treatment/procedure(s) were performed by non-physician practitioner and as supervising physician I was immediately available for consultation/collaboration. Devoria Albe, MD, Armando Gang   Ward Givens, MD 08/07/11 [phone]

## 2012-03-19 ENCOUNTER — Emergency Department (HOSPITAL_COMMUNITY)
Admission: EM | Admit: 2012-03-19 | Discharge: 2012-03-19 | Disposition: A | Discharge status: Home / Self Care | Payer: Self-pay | Attending: Emergency Medicine | Admitting: Emergency Medicine

## 2012-03-19 ENCOUNTER — Encounter (HOSPITAL_COMMUNITY): Payer: Self-pay

## 2012-03-19 ENCOUNTER — Emergency Department (HOSPITAL_COMMUNITY): Payer: Self-pay

## 2012-03-19 DIAGNOSIS — N5082 Scrotal pain: Secondary | ICD-10-CM

## 2012-03-19 DIAGNOSIS — I861 Scrotal varices: Secondary | ICD-10-CM | POA: Insufficient documentation

## 2012-03-19 DIAGNOSIS — Z9089 Acquired absence of other organs: Secondary | ICD-10-CM | POA: Insufficient documentation

## 2012-03-19 DIAGNOSIS — N509 Disorder of male genital organs, unspecified: Secondary | ICD-10-CM | POA: Insufficient documentation

## 2012-03-19 HISTORY — DX: Epididymitis: N45.1

## 2012-03-19 LAB — CBC WITH DIFFERENTIAL/PLATELET
Basophils Absolute: 0 10*3/uL (ref 0.0–0.1)
Basophils Relative: 0 % (ref 0–1)
MCHC: 34.4 g/dL (ref 30.0–36.0)
Neutro Abs: 11.1 10*3/uL — ABNORMAL HIGH (ref 1.7–7.7)
Neutrophils Relative %: 76 % (ref 43–77)
RDW: 14.8 % (ref 11.5–15.5)

## 2012-03-19 LAB — COMPREHENSIVE METABOLIC PANEL
AST: 17 U/L (ref 0–37)
Albumin: 4 g/dL (ref 3.5–5.2)
Alkaline Phosphatase: 88 U/L (ref 39–117)
Chloride: 102 mEq/L (ref 96–112)
Potassium: 5.3 mEq/L — ABNORMAL HIGH (ref 3.5–5.1)
Total Bilirubin: 0.1 mg/dL — ABNORMAL LOW (ref 0.3–1.2)

## 2012-03-19 LAB — URINALYSIS, ROUTINE W REFLEX MICROSCOPIC
Glucose, UA: NEGATIVE mg/dL
Hgb urine dipstick: NEGATIVE
Ketones, ur: NEGATIVE mg/dL
Protein, ur: NEGATIVE mg/dL

## 2012-03-19 MED ORDER — KETOROLAC TROMETHAMINE 30 MG/ML IJ SOLN
30.0000 mg | Freq: Once | INTRAMUSCULAR | Status: AC
  Administered 2012-03-19: 30 mg via INTRAVENOUS
  Filled 2012-03-19: qty 1

## 2012-03-19 MED ORDER — OXYCODONE-ACETAMINOPHEN 5-325 MG PO TABS: 1.0000 | ORAL_TABLET | Freq: Four times a day (QID) | ORAL | Status: AC | PRN

## 2012-03-19 MED ORDER — HYDROMORPHONE HCL PF 1 MG/ML IJ SOLN
1.0000 mg | Freq: Once | INTRAMUSCULAR | Status: AC
  Administered 2012-03-19: 1 mg via INTRAVENOUS
  Filled 2012-03-19: qty 1

## 2012-03-19 MED ORDER — DOXYCYCLINE HYCLATE 100 MG PO CAPS: 100.0000 mg | ORAL_CAPSULE | Freq: Two times a day (BID) | ORAL | Status: AC

## 2012-03-19 MED ORDER — ONDANSETRON HCL 4 MG/2ML IJ SOLN
4.0000 mg | Freq: Once | INTRAMUSCULAR | Status: AC
  Administered 2012-03-19: 4 mg via INTRAVENOUS
  Filled 2012-03-19: qty 2

## 2012-03-19 NOTE — ED Provider Notes (Signed)
History   This chart was scribed for [name] Lennert, MD by Sofie Rower. The patient was seen in room APA10/APA10 and the patient's care was started at 8:50AM.    CSN: [id number]  Arrival date & time 03/19/12  1478   First MD Initiated Contact with Patient 03/19/12 0850      Chief Complaint  Patient presents with  . Groin Pain    (Consider location/radiation/quality/duration/timing/severity/associated sxs/prior treatment) Patient is a 41 y.o. male presenting with testicular pain. The history is provided by the patient. No language interpreter was used.  Testicle Pain This is a new problem. The current episode started yesterday. The problem occurs constantly. The problem has been gradually worsening. Pertinent negatives include no chest pain, no abdominal pain, no headaches and no shortness of breath. The symptoms are aggravated by exertion. Nothing relieves the symptoms. He has tried nothing for the symptoms. The treatment provided no relief.    PCP is Dr. Renard Matter.   Past Medical History  Diagnosis Date  . Epididymitis, bilateral     Past Surgical History  Procedure Date  . Cholecystectomy   . Knee surgery     No family history on file.  History  Substance Use Topics  . Smoking status: Former Games developer  . Smokeless tobacco: Not on file  . Alcohol Use: No      Review of Systems  Respiratory: Negative for shortness of breath.   Cardiovascular: Negative for chest pain.  Gastrointestinal: Negative for abdominal pain.  Genitourinary: Positive for testicular pain.  Neurological: Negative for headaches.  All other systems reviewed and are negative.    Allergies  Zolpidem tartrate  Home Medications   Current Outpatient Rx  Name Route Sig Dispense Refill  . ALBUTEROL SULFATE HFA 108 (90 BASE) MCG/ACT IN AERS Inhalation Inhale 1-2 puffs into the lungs every 6 (six) hours as needed for wheezing. 1 Inhaler 0  . AZITHROMYCIN 250 MG PO TABS  One po QD until gone. (initial  dose given in the ED). 4 tablet 0  . GUAIFENESIN-CODEINE 100-10 MG/5ML PO SYRP  10 mls po q 4-6 hrs prn cough 240 mL 0    BP 142/69  Pulse 101  Temp 98.3 F (36.8 C) (Oral)  Resp 20  Ht 6' (1.829 m)  Wt 250 lb (113.399 kg)  BMI 33.91 kg/m2  SpO2 99%  Physical Exam  Nursing note and vitals reviewed. Constitutional: He is oriented to person, place, and time. He appears well-developed.  HENT:  Head: Normocephalic and atraumatic.  Eyes: Conjunctivae normal and EOM are normal. No scleral icterus.  Neck: Neck supple. No thyromegaly present.  Cardiovascular: Normal rate and regular rhythm.  Exam reveals no gallop and no friction rub.   No murmur heard. Pulmonary/Chest: No stridor. He has no wheezes. He has no rales. He exhibits no tenderness.  Abdominal: He exhibits no distension. There is no tenderness. There is no rebound.  Genitourinary: Right testis shows tenderness. Right testis shows no swelling.  Musculoskeletal: Normal range of motion. He exhibits no edema.  Lymphadenopathy:    He has no cervical adenopathy.  Neurological: He is oriented to person, place, and time. Coordination normal.  Skin: No rash noted. No erythema.  Psychiatric: He has a normal mood and affect. His behavior is normal.    ED Course  Procedures (including critical care time)  DIAGNOSTIC STUDIES: Oxygen Saturation is 99% on room air, normal by my interpretation.    COORDINATION OF CARE:    8:54AM- Treatment plan discussed  with patient. Pt agrees with treatment.   Results for orders placed during the hospital encounter of 03/19/12  CBC WITH DIFFERENTIAL      Component Value Range   WBC 14.6 (*) 4.0 - 10.5 K/uL   RBC 4.57  4.22 - 5.81 MIL/uL   Hemoglobin 15.2  13.0 - 17.0 g/dL   HCT 19.1  47.8 - 29.5 %   MCV 96.7  78.0 - 100.0 fL   MCH 33.3  26.0 - 34.0 pg   MCHC 34.4  30.0 - 36.0 g/dL   RDW 62.1  30.8 - 65.7 %   Platelets 285  150 - 400 K/uL   Neutrophils Relative 76  43 - 77 %   Neutro  Abs 11.1 (*) 1.7 - 7.7 K/uL   Lymphocytes Relative 13  12 - 46 %   Lymphs Abs 1.9  0.7 - 4.0 K/uL   Monocytes Relative 10  3 - 12 %   Monocytes Absolute 1.4 (*) 0.1 - 1.0 K/uL   Eosinophils Relative 1  0 - 5 %   Eosinophils Absolute 0.2  0.0 - 0.7 K/uL   Basophils Relative 0  0 - 1 %   Basophils Absolute 0.0  0.0 - 0.1 K/uL  COMPREHENSIVE METABOLIC PANEL      Component Value Range   Sodium 136  135 - 145 mEq/L   Potassium 5.3 (*) 3.5 - 5.1 mEq/L   Chloride 102  96 - 112 mEq/L   CO2 27  19 - 32 mEq/L   Glucose, Bld 116 (*) 70 - 99 mg/dL   BUN 13  6 - 23 mg/dL   Creatinine, Ser 8.46  0.50 - 1.35 mg/dL   Calcium 96.2  8.4 - 95.2 mg/dL   Total Protein 7.6  6.0 - 8.3 g/dL   Albumin 4.0  3.5 - 5.2 g/dL   AST 17  0 - 37 U/L   ALT 23  0 - 53 U/L   Alkaline Phosphatase 88  39 - 117 U/L   Total Bilirubin 0.1 (*) 0.3 - 1.2 mg/dL   GFR calc non Af Amer >90  >90 mL/min   GFR calc Af Amer >90  >90 mL/min   US Scrotum  03/19/2012  *RADIOLOGY REPORT*  Clinical Data:  Right groin pain.  SCROTAL ULTRASOUND DOPPLER ULTRASOUND OF THE TESTICLES  Technique: Complete ultrasound examination of the testicles, epididymis, and other scrotal structures was performed.  Color and spectral Doppler ultrasound were also utilized to evaluate blood flow to the testicles.  Comparison:  CT 05/02/2009  Findings:  Right testis:  3.4 x 1.7 x 2.7 cm.  No focal abnormality.  The normal arterial and venous blood flow.  Left testis:  3.0 x 1.6 x 2.2 cm.  No focal abnormality.  Normal arterial venous blood flow.  Right epididymis:  Normal in size and appearance.  Left epididymis:  Small epididymal head cyst.  Otherwise unremarkable.  Hydrocele:  Absent.  Varicocele:  Left varicocele noted.  Pulsed Doppler interrogation of both testes demonstrates low resistance flow bilaterally. Incidentally noted is thickened right scrotal skin.  IMPRESSION: No testicular abnormality.  Thickening of the right scrotal skin.  Left varicocele.    Original Report Authenticated By: Cyndie Chime, M.D.    [place] Art/ven Flow Abd Pelv Doppler  03/19/2012  *RADIOLOGY REPORT*  Clinical Data:  Right groin pain.  SCROTAL ULTRASOUND DOPPLER ULTRASOUND OF THE TESTICLES  Technique: Complete ultrasound examination of the testicles, epididymis, and other scrotal structures was performed.  Color and spectral Doppler ultrasound were also utilized to evaluate blood flow to the testicles.  Comparison:  CT 05/02/2009  Findings:  Right testis:  3.4 x 1.7 x 2.7 cm.  No focal abnormality.  The normal arterial and venous blood flow.  Left testis:  3.0 x 1.6 x 2.2 cm.  No focal abnormality.  Normal arterial venous blood flow.  Right epididymis:  Normal in size and appearance.  Left epididymis:  Small epididymal head cyst.  Otherwise unremarkable.  Hydrocele:  Absent.  Varicocele:  Left varicocele noted.  Pulsed Doppler interrogation of both testes demonstrates low resistance flow bilaterally. Incidentally noted is thickened right scrotal skin.  IMPRESSION: No testicular abnormality.  Thickening of the right scrotal skin.  Left varicocele.   Original Report Authenticated By: Cyndie Chime, M.D.       No diagnosis found.    MDM     The chart was scribed for me under my direct supervision.  I personally performed the history, physical, and medical decision making and all procedures in the evaluation of this patient.[name] Lennert, MD 03/19/12 1318

## 2012-03-19 NOTE — ED Notes (Signed)
Discharge instructions reviewed with pt, questions answered. Pt verbalized understanding.  

## 2012-03-19 NOTE — ED Notes (Signed)
Pt reports right groin/testicle pain since last night

## 2012-04-02 ENCOUNTER — Emergency Department (HOSPITAL_COMMUNITY)
Admission: EM | Admit: 2012-04-02 | Discharge: 2012-04-03 | Disposition: A | Discharge status: Home / Self Care | Payer: Self-pay | Attending: Emergency Medicine | Admitting: Emergency Medicine

## 2012-04-02 ENCOUNTER — Encounter (HOSPITAL_COMMUNITY): Payer: Self-pay

## 2012-04-02 DIAGNOSIS — I10 Essential (primary) hypertension: Secondary | ICD-10-CM

## 2012-04-02 DIAGNOSIS — N509 Disorder of male genital organs, unspecified: Secondary | ICD-10-CM | POA: Insufficient documentation

## 2012-04-02 DIAGNOSIS — N5082 Scrotal pain: Secondary | ICD-10-CM

## 2012-04-02 DIAGNOSIS — Z87891 Personal history of nicotine dependence: Secondary | ICD-10-CM | POA: Insufficient documentation

## 2012-04-02 NOTE — ED Notes (Signed)
Right testicle pain, was here for the same around two weeks ago. Treated for epididymitis and finished antibiotic per pt. Now it has came back per pt.

## 2012-04-03 MED ORDER — HYDROMORPHONE HCL PF 1 MG/ML IJ SOLN
1.0000 mg | Freq: Once | INTRAMUSCULAR | Status: AC
  Administered 2012-04-03: 1 mg via INTRAVENOUS
  Filled 2012-04-03: qty 1

## 2012-04-03 MED ORDER — OXYCODONE-ACETAMINOPHEN 5-325 MG PO TABS: 1.0000 | ORAL_TABLET | ORAL | Status: DC | PRN

## 2012-04-03 MED ORDER — DOXYCYCLINE HYCLATE 100 MG PO CAPS: 100.0000 mg | ORAL_CAPSULE | Freq: Two times a day (BID) | ORAL | Status: DC

## 2012-04-03 NOTE — ED Notes (Signed)
Pt seen & TX in the er a week ago. Finished medication prescribed. Pain came back in the right testicle yesterday & has gotten worse.

## 2012-04-03 NOTE — ED Notes (Signed)
Pt alert & oriented x4, stable gait. Patient given discharge instructions, paperwork & prescription(s). Patient  instructed to stop at the registration desk to finish any additional paperwork. Patient verbalized understanding. Pt left department w/ no further questions. 

## 2012-04-03 NOTE — ED Provider Notes (Signed)
History     CSN: [id number]  Arrival date & time 04/02/12  2226   First MD Initiated Contact with Patient 04/03/12 0005      Chief Complaint  Patient presents with  . Testicle Pain     Patient is a 41 y.o. male presenting with testicular pain. The history is provided by the patient.  Testicle Pain This is a recurrent problem. The current episode started yesterday. The problem occurs constantly. The problem has been gradually worsening. Pertinent negatives include no chest pain, no abdominal pain, no headaches and no shortness of breath. Exacerbated by: palpation. The symptoms are relieved by rest. He has tried rest for the symptoms. The treatment provided no relief.  pt reports recent episode of right scrotal pain (no trauma) Reports seen in the ED, given pain meds and antibiotics and pain improved However he finished those meds and now pain returned No fever/vomiting No abdominal pain He is able to urinate No back pain No weakness is reported  Past Medical History  Diagnosis Date  . Epididymitis, bilateral     Past Surgical History  Procedure Date  . Cholecystectomy   . Knee surgery     History reviewed. No pertinent family history.  History  Substance Use Topics  . Smoking status: Former Games developer  . Smokeless tobacco: Not on file  . Alcohol Use: No      Review of Systems  Respiratory: Negative for shortness of breath.   Cardiovascular: Negative for chest pain.  Gastrointestinal: Negative for abdominal pain.  Genitourinary: Positive for testicular pain.  Neurological: Negative for headaches.  All other systems reviewed and are negative.    Allergies  Zolpidem tartrate  Home Medications   Current Outpatient Rx  Name Route Sig Dispense Refill  . ACETAMINOPHEN 500 MG PO TABS Oral Take 500 mg by mouth every 6 (six) hours as needed. For pain    . DIAZEPAM 5 MG PO TABS Oral Take 5 mg by mouth 2 (two) times daily as needed. For anxiety      BP 158/118   Pulse 105  Temp 98.3 F (36.8 C) (Oral)  Resp 20  Ht 6' (1.829 m)  Wt 250 lb (113.399 kg)  BMI 33.91 kg/m2  SpO2 100%  Physical Exam CONSTITUTIONAL: Well developed/well nourished HEAD AND FACE: Normocephalic/atraumatic EYES: EOMI/PERRL ENMT: Mucous membranes moist NECK: supple no meningeal signs SPINE:entire spine nontender CV: S1/S2 noted, no murmurs/rubs/gallops noted LUNGS: Lungs are clear to auscultation bilaterally, no apparent distress ABDOMEN: soft, nontender, no rebound or guarding GU:no cva tenderness.  +cremasteric reflex bilaterally.  No erythema/edema/crepitance to scrotum.  Right testicle is tender to palpation but no erythema noted, it is in appropriate position and descended.  No hernia noted.  Chaperone present NEURO: Pt is awake/alert, moves all extremitiesx4 EXTREMITIES: pulses normal, full ROM SKIN: warm, color normal PSYCH: no abnormalities of mood noted  ED Course  Procedures   Pt with recent workup with Ct imaging and scrotal ultrasound that was negative.  He reports h/o epidimytis previously, was told it was due to previous vasectomy.  He reports having this in years past.  He thinks he needs longer course of antibiotics.  Clinically he does not have torsion/hernia or other acute abdominal/urologic process.  He does not want any further testing/labs/imaging/urinalysis.    MDM  Nursing notes including past medical history and social history reviewed and considered in documentation Previous records reviewed and considered         Joya Gaskins, MD 04/03/12  0040 

## 2012-05-07 ENCOUNTER — Encounter (HOSPITAL_COMMUNITY): Payer: Self-pay

## 2012-05-07 ENCOUNTER — Encounter (HOSPITAL_COMMUNITY): Payer: Self-pay | Admitting: *Deleted

## 2012-05-07 ENCOUNTER — Emergency Department (HOSPITAL_COMMUNITY)
Admission: EM | Admit: 2012-05-07 | Discharge: 2012-05-07 | Disposition: A | Discharge status: Other Acute Inpatient Hospital | Payer: Self-pay | Attending: Emergency Medicine | Admitting: Emergency Medicine

## 2012-05-07 ENCOUNTER — Inpatient Hospital Stay (HOSPITAL_COMMUNITY)
Admission: RE | Admit: 2012-05-07 | Discharge: 2012-05-15 | DRG: 885 | Disposition: A | Discharge status: Home / Self Care | Payer: 59 | Attending: Psychiatry | Admitting: Psychiatry

## 2012-05-07 DIAGNOSIS — F4323 Adjustment disorder with mixed anxiety and depressed mood: Secondary | ICD-10-CM | POA: Diagnosis present

## 2012-05-07 DIAGNOSIS — F4329 Adjustment disorder with other symptoms: Secondary | ICD-10-CM | POA: Diagnosis present

## 2012-05-07 DIAGNOSIS — F329 Major depressive disorder, single episode, unspecified: Principal | ICD-10-CM | POA: Diagnosis present

## 2012-05-07 DIAGNOSIS — F3289 Other specified depressive episodes: Secondary | ICD-10-CM | POA: Insufficient documentation

## 2012-05-07 DIAGNOSIS — F1110 Opioid abuse, uncomplicated: Secondary | ICD-10-CM | POA: Diagnosis present

## 2012-05-07 DIAGNOSIS — F17200 Nicotine dependence, unspecified, uncomplicated: Secondary | ICD-10-CM | POA: Insufficient documentation

## 2012-05-07 DIAGNOSIS — K831 Obstruction of bile duct: Secondary | ICD-10-CM

## 2012-05-07 DIAGNOSIS — F4381 Prolonged grief disorder: Secondary | ICD-10-CM | POA: Diagnosis present

## 2012-05-07 DIAGNOSIS — Z79899 Other long term (current) drug therapy: Secondary | ICD-10-CM

## 2012-05-07 DIAGNOSIS — Z87448 Personal history of other diseases of urinary system: Secondary | ICD-10-CM | POA: Insufficient documentation

## 2012-05-07 LAB — CBC
HCT: 44.3 % (ref 39.0–52.0)
Hemoglobin: 15.2 g/dL (ref 13.0–17.0)
MCV: 96.1 fL (ref 78.0–100.0)
RDW: 14.1 % (ref 11.5–15.5)
WBC: 14.4 10*3/uL — ABNORMAL HIGH (ref 4.0–10.5)

## 2012-05-07 LAB — COMPREHENSIVE METABOLIC PANEL
Albumin: 4.4 g/dL (ref 3.5–5.2)
BUN: 8 mg/dL (ref 6–23)
Chloride: 101 mEq/L (ref 96–112)
Creatinine, Ser: 0.74 mg/dL (ref 0.50–1.35)
GFR calc Af Amer: >90 mL/min (ref >90)
GFR calc non Af Amer: >90 mL/min (ref >90)
Glucose, Bld: 87 mg/dL (ref 70–99)
Total Bilirubin: 0.2 mg/dL — ABNORMAL LOW (ref 0.3–1.2)

## 2012-05-07 LAB — RAPID URINE DRUG SCREEN, HOSP PERFORMED: Opiates: POSITIVE — AB

## 2012-05-07 LAB — URINALYSIS, ROUTINE W REFLEX MICROSCOPIC
Glucose, UA: NEGATIVE mg/dL
Hgb urine dipstick: NEGATIVE
Specific Gravity, Urine: 1.024 (ref 1.005–1.030)
Urobilinogen, UA: 0.2 mg/dL (ref 0.0–1.0)
pH: 6 (ref 5.0–8.0)

## 2012-05-07 LAB — ETHANOL: Alcohol, Ethyl (B): <11 mg/dL (ref 0–11)

## 2012-05-07 MED ORDER — MAGNESIUM HYDROXIDE 400 MG/5ML PO SUSP: 30.0000 mL | Freq: Every day | ORAL | Status: DC | PRN

## 2012-05-07 MED ORDER — ALUM & MAG HYDROXIDE-SIMETH 200-200-20 MG/5ML PO SUSP: 30.0000 mL | ORAL | Status: DC | PRN

## 2012-05-07 MED ORDER — HYDROCODONE-ACETAMINOPHEN 10-325 MG PO TABS
1.0000 | ORAL_TABLET | Freq: Four times a day (QID) | ORAL | Status: DC | PRN
  Administered 2012-05-07 – 2012-05-15 (×28): 1 via ORAL
  Filled 2012-05-07 (×28): qty 1

## 2012-05-07 MED ORDER — NICOTINE 21 MG/24HR TD PT24
21.0000 mg | MEDICATED_PATCH | Freq: Every day | TRANSDERMAL | Status: DC
  Administered 2012-05-07 – 2012-05-15 (×9): 21 mg via TRANSDERMAL
  Filled 2012-05-07 (×8): qty 1
  Filled 2012-05-07: qty 14
  Filled 2012-05-07 (×4): qty 1

## 2012-05-07 MED ORDER — DIAZEPAM 5 MG PO TABS
5.0000 mg | ORAL_TABLET | Freq: Two times a day (BID) | ORAL | Status: DC | PRN
  Administered 2012-05-07 – 2012-05-08 (×2): 5 mg via ORAL
  Filled 2012-05-07 (×2): qty 1

## 2012-05-07 MED ORDER — TRAZODONE HCL 50 MG PO TABS
50.0000 mg | ORAL_TABLET | Freq: Every evening | ORAL | Status: DC | PRN
  Administered 2012-05-07 – 2012-05-12 (×4): 50 mg via ORAL
  Filled 2012-05-07 (×3): qty 1
  Filled 2012-05-07: qty 14
  Filled 2012-05-07: qty 1

## 2012-05-07 MED ORDER — ACETAMINOPHEN 325 MG PO TABS
650.0000 mg | ORAL_TABLET | Freq: Four times a day (QID) | ORAL | Status: DC | PRN
  Administered 2012-05-09: 325 mg via ORAL
  Administered 2012-05-14 (×2): 650 mg via ORAL

## 2012-05-07 NOTE — Progress Notes (Signed)
Patient pleasant and cooperative during admission assessment. Patient verbalizes "I am depressed every year around this time, since I lost my wife." Patient's wife died 12/07/24approx 5 years ago. Patient is now a single father with two children ages 43 and 12 years. Children are with patient's parents at this time. Patient verbalizes "every time I look at the kids I see my wife, I can't get over her, she was my soulmate." Patient states "I don't want to leave my kids but I do think about suicide sometimes, is that selfish?" Patient given emotional support and encouragement. Patient contracts verbally with RN for safety. Patient denies HI, denies AVH. Patient does have chronic knee pain for which he is prescribed lortab, patient also prescribed valium for anxiety. Patient verbalized understanding of Fall Prevention Safety Plan, patient understands he is at "low" risk for falls at this time. Patient oriented to unit/staff. Patient safe on unit with Q15 minute checks for safety. Will continue to monitor.

## 2012-05-07 NOTE — ED Notes (Signed)
Pt reports SI and depression x4 days. Hx of depression. Sts his wife passed away 5 years ago and this is the anniversary of her death and this is always a hard time of year for him. Pt from [facility], has been accepted with bed available pending medical clearance.

## 2012-05-07 NOTE — ED Provider Notes (Signed)
History     CSN: [id number]  Arrival date & time 05/07/12  1213   First MD Initiated Contact with Patient 05/07/12 1240      Chief Complaint  Patient presents with  . Medical Clearance    (Consider location/radiation/quality/duration/timing/severity/associated sxs/prior treatment) HPI Comments: [name] [name] is a 41 y.o. Male , who states that he feels like he would be better off dead. He does not have active suicidal ideation or plan. No history of suicidal attempt. He sees a psychiatrist for medication, but not a therapist, for his depression. He has 2 young children at home. They are safe, under the care of his parents. He went to the behavioral health hospital, was accepted for admission, and sent here for clearance. He has not any recent illness. He is taking his medication as prescribed. There are no modifying factors.   The history is provided by the patient.    Past Medical History  Diagnosis Date  . Epididymitis, bilateral     Past Surgical History  Procedure Date  . Cholecystectomy   . Knee surgery     No family history on file.  History  Substance Use Topics  . Smoking status: Current Every Day Smoker  . Smokeless tobacco: Not on file  . Alcohol Use: No      Review of Systems  All other systems reviewed and are negative.    Allergies  Zolpidem tartrate  Home Medications   No current outpatient prescriptions on file.  BP 144/94  Pulse 84  Temp 97.9 F (36.6 C) (Oral)  Resp 16  SpO2 98%  Physical Exam  Nursing note and vitals reviewed. Constitutional: He is oriented to person, place, and time. He appears well-developed and well-nourished.  HENT:  Head: Normocephalic and atraumatic.  Right Ear: External ear normal.  Left Ear: External ear normal.  Eyes: Conjunctivae normal and EOM are normal. Pupils are equal, round, and reactive to light.  Neck: Normal range of motion and phonation normal. Neck supple.  Cardiovascular: Normal rate,  regular rhythm, normal heart sounds and intact distal pulses.   Pulmonary/Chest: Effort normal and breath sounds normal. He exhibits no bony tenderness.  Abdominal: Soft. Normal appearance. There is no tenderness.  Musculoskeletal: Normal range of motion.  Neurological: He is alert and oriented to person, place, and time. He has normal strength. No cranial nerve deficit or sensory deficit. He exhibits normal muscle tone. Coordination normal.  Skin: Skin is warm, dry and intact.  Psychiatric: His behavior is normal. Judgment and thought content normal.       Affect is flat    ED Course  Procedures (including critical care time)  Usual medical clearance testing ordered    Labs Reviewed  CBC - Abnormal; Notable for the following:    WBC 14.4 (*)     All other components within normal limits  COMPREHENSIVE METABOLIC PANEL - Abnormal; Notable for the following:    Total Bilirubin 0.2 (*)     All other components within normal limits  URINE RAPID DRUG SCREEN (HOSP PERFORMED) - Abnormal; Notable for the following:    Opiates POSITIVE (*)     Benzodiazepines POSITIVE (*)     All other components within normal limits  ETHANOL  URINALYSIS, ROUTINE W REFLEX MICROSCOPIC  LAB REPORT - SCANNED   No results found.   1. Depression       MDM  Depression with hopelessness. He is at risk for decompensation and suicide.  Medically cleared in the  emergency department, stable for admission to a psychiatric facility     PIlan: Admit- Transfer to the [facility]  Flint Melter, MD 05/08/12 [phone]

## 2012-05-07 NOTE — Progress Notes (Signed)
Patient did attend the evening karaoke group. Pt was attentive, engaged, and supportive.  

## 2012-05-07 NOTE — Tx Team (Signed)
Initial Interdisciplinary Treatment Plan  PATIENT STRENGTHS: (choose at least two) Ability for insight Communication skills General fund of knowledge Motivation for treatment/growth  PATIENT STRESSORS: Medication change or noncompliance Traumatic event   PROBLEM LIST: Problem List/Patient Goals Date to be addressed Date deferred Reason deferred Estimated date of resolution  Suicidal Ideations      Depression                                                 DISCHARGE CRITERIA:  Need for constant or close observation no longer present Reduction of life-threatening or endangering symptoms to within safe limits  PRELIMINARY DISCHARGE PLAN: Attend aftercare/continuing care group Outpatient therapy  PATIENT/FAMIILY INVOLVEMENT: This treatment plan has been presented to and reviewed with the patient, [name] [name], and/or family member, .  The patient and family have been given the opportunity to ask questions and make suggestions.  [name] 05/07/2012, 4:08 PM

## 2012-05-07 NOTE — ED Notes (Addendum)
Report given to [facility], rn at [facility]  305 bed 1
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
## 2012-05-07 NOTE — ED Notes (Signed)
Pt given coke and chips. Aware of need for urine sample. Urine sample cup at bedside.

## 2012-05-08 ENCOUNTER — Encounter (HOSPITAL_COMMUNITY): Payer: Self-pay | Admitting: Psychiatry

## 2012-05-08 DIAGNOSIS — F4329 Adjustment disorder with other symptoms: Secondary | ICD-10-CM | POA: Diagnosis present

## 2012-05-08 DIAGNOSIS — F329 Major depressive disorder, single episode, unspecified: Secondary | ICD-10-CM | POA: Diagnosis present

## 2012-05-08 DIAGNOSIS — F4321 Adjustment disorder with depressed mood: Secondary | ICD-10-CM | POA: Diagnosis present

## 2012-05-08 MED ORDER — MIRTAZAPINE 30 MG PO TABS
30.0000 mg | ORAL_TABLET | Freq: Every day | ORAL | Status: DC
  Administered 2012-05-08 – 2012-05-10 (×3): 30 mg via ORAL
  Filled 2012-05-08 (×5): qty 1

## 2012-05-08 MED ORDER — LORAZEPAM 1 MG PO TABS
1.0000 mg | ORAL_TABLET | Freq: Four times a day (QID) | ORAL | Status: DC | PRN
  Administered 2012-05-08 – 2012-05-15 (×25): 1 mg via ORAL
  Filled 2012-05-08 (×25): qty 1

## 2012-05-08 MED ORDER — BUPROPION HCL ER (SR) 150 MG PO TB12
150.0000 mg | ORAL_TABLET | Freq: Two times a day (BID) | ORAL | Status: DC
  Administered 2012-05-08 – 2012-05-12 (×9): 150 mg via ORAL
  Filled 2012-05-08 (×12): qty 1

## 2012-05-08 MED ORDER — DOXYCYCLINE HYCLATE 100 MG PO TABS
100.0000 mg | ORAL_TABLET | Freq: Two times a day (BID) | ORAL | Status: DC
  Filled 2012-05-08 (×4): qty 1

## 2012-05-08 MED ORDER — ARIPIPRAZOLE 2 MG PO TABS
2.0000 mg | ORAL_TABLET | Freq: Every day | ORAL | Status: DC
  Administered 2012-05-08 – 2012-05-11 (×4): 2 mg via ORAL
  Filled 2012-05-08 (×8): qty 1

## 2012-05-08 NOTE — Progress Notes (Signed)
Psychoeducational Group Note  Date:  05/08/2012 Time:  1000  Group Topic/Focus:  Therapeutic Activity  Participation Level:  Active  Participation Quality:  Appropriate, Attentive and Sharing  Affect:  Appropriate  Cognitive:  Appropriate  Insight:  Good  Engagement in Group:  Good  Additional Comments:  Patient participated in therapeutic activity of apples to apples. One person in the group is chosen to be the judge and picks a card while other group members have five cards and choose one card related the closest to the judge card word. The person who has the most of the green cards which are cards the judges have win. Patient was creative and encouraging judge on why her card should win.   [name] [name] 05/08/2012, 1:43 PM

## 2012-05-08 NOTE — Social Work (Signed)
  [facility] Group Note : Clinical Social Worker Group Therapy  05/08/2012  1:15 PM  Type of Therapy:  Group Therapy  Participation Level:  Appropriate  Participation Quality:  Appropriate   Affect:  Depressed  Cognitive:  Alert  Insight:  Good  Engagement in Group:  Good  Engagement in Therapy:  Good  Modes of Intervention:  Clarification, Education, Socialization and Support  Summary of Progress/Problems: The topic for today was feelings about relapse.  Pt discussed what relapse prevention is to them and identified triggers that they are on the path to relapse.  Pt processed their feeling towards relapse and was able to relate to peers.  Pt discussed coping skills that can be used for relapse prevention.   Pt discussed feeling like he will never get over his wife's death and trying to put on a front for his children that he is strong and okay.     Reyes Ivan, LCSWA 05/08/2012 2:44 PM
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
## 2012-05-08 NOTE — Progress Notes (Signed)
Psychoeducational Group Note  Date:  05/08/2012 Time:  1100  Group Topic/Focus:  Relapse Prevention Planning:   The focus of this group is to define relapse and discuss the need for planning to combat relapse.  Participation Level:  Active  Participation Quality:  Appropriate, Attentive and Supportive  Affect:  Appropriate  Cognitive:  Appropriate  Insight:  Good  Engagement in Group:  Good  Additional Comments:  Pt attended and participated in group discussing relapse prevention planning.  [name] [name] 05/08/2012, 7:09 PM
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
## 2012-05-08 NOTE — [facility] Suicide Risk Assessment (Signed)
Suicide Risk Assessment  Admission Assessment     Nursing information obtained from:    Demographic factors:    Current Mental Status:    Loss Factors:    Historical Factors:    Risk Reduction Factors:     CLINICAL FACTORS:   Depression:   Anhedonia Hopelessness Insomnia Chronic Pain  COGNITIVE FEATURES THAT CONTRIBUTE TO RISK: None   SUICIDE RISK:   Mild:  Suicidal ideation of limited frequency, intensity, duration, and specificity.  There are no identifiable plans, no associated intent, mild dysphoria and related symptoms, good self-control (both objective and subjective assessment), few other risk factors, and identifiable protective factors, including available and accessible social support.  PLAN OF CARE: Supportive approach/coping skills                               Will change the Valium to Ativan                               Will optimize treatment with Wellbutrin, up to 300 mg daily                               Consider adding an SSRI vs Abilify 2 mg daily                               Grief-loss   Patsye Sullivant A 05/08/2012, 11:02 AM

## 2012-05-08 NOTE — Progress Notes (Signed)
Patient ID: [name] [name], male   DOB: Nov 30, 1970, 41 y.o.   MRN: [id number] D: Patient present with anxious and depressed  mood. Pt requested medication for anxiety. Cooperative with assessment. No acute distressed noted. Denies SI/HI/AV and contract for safety. Offered no additional question or concerns  A: Medication administered as prescribed. Safety has been maintained with Q15 minutes observation. Supported and encouragement provided to attend groups  R: Patient remains safe. He is complaint with medication and group programming. Safety has been maintained Q15 and continue current POC.

## 2012-05-08 NOTE — Progress Notes (Signed)
[facility] Adult Inpatient Family/Significant Other Suicide Prevention Education  Suicide Prevention Education:  Patient Refusal for Family/Significant Other Suicide Prevention Education: The patient Knowledge A Eversley has refused to provide written consent for family/significant other to be provided Family/Significant Other Suicide Prevention Education during admission and/or prior to discharge.  Physician notified.    Patient shared his parents are older and not in good health and he does not want to worry them.  Patient denies having any guns in the home.  [name] 05/08/2012, 1:07 PM

## 2012-05-08 NOTE — Progress Notes (Signed)
D:  Per pt self inventory pt reports poor sleep, appetite improving, energy level low, ability to pay attention is poor, rates depression at a 7 out of 10, hopelessness at a 7 out of 10, admits intermittent passive SI but verbally contracts for safety. Pain 3/10  A:  Encouraged pt to participate in group activities, emotional support given, encouraged pt to use coping skills.  R:  Pt attends groups, went to gym for rec therapy this afternoon, pt has no complaints at this time.

## 2012-05-09 DIAGNOSIS — F332 Major depressive disorder, recurrent severe without psychotic features: Secondary | ICD-10-CM

## 2012-05-09 DIAGNOSIS — F4323 Adjustment disorder with mixed anxiety and depressed mood: Secondary | ICD-10-CM

## 2012-05-09 MED ORDER — PRAZOSIN HCL 1 MG PO CAPS
1.0000 mg | ORAL_CAPSULE | Freq: Every day | ORAL | Status: DC
  Administered 2012-05-09 – 2012-05-10 (×2): 1 mg via ORAL
  Filled 2012-05-09 (×5): qty 1

## 2012-05-09 NOTE — Progress Notes (Signed)
Patient ID: [name] [name], male   DOB: Nov 06, 1970, 41 y.o.   MRN: [id number] D)  Resting quietly tonight, lying on left side, eyes closed, no c/o's voiced.  Resp reg, unlabored.  A)  Will continue to monitor for safety q 15 minutes.  R)  Safety maintained.

## 2012-05-09 NOTE — Progress Notes (Signed)
[facility] Group Notes:  (Counselor/Nursing/MHT/Case Management/Adjunct)  05/09/2012 12:21 PM  Type of Therapy:  Group Therapy  Participation Level:  Did Not Attend  [name] [name] 05/09/2012, 12:21 PM 

## 2012-05-09 NOTE — Progress Notes (Signed)
[facility] MD Progress Note  05/09/2012 1:40 PM Halley A Englander  MRN:  [id number]  Diagnosis:   Axis I: Adjustment Disorder with Mixed Emotional Features, Bereavement and Major Depression, Recurrent severe Axis II: Deferred Axis III:  Past Medical History  Diagnosis Date  . Epididymitis, bilateral    Subjective: [name] [name] continues to express a significant amount of depression and anxiety over the sudden death of his wife 3 years ago on November 6. He complains of having dreams about his deceased wife that began as being pleasant but then turned dark, and wake him from sleep. He denies any suicidal or homicidal ideation. He denies any auditory or visual hallucinations.  He expresses some concerns about the cost of Abilify, and wonders if he should be tried on something different before he leaves the hospital.  ADL's:  Intact  Sleep: Good  Appetite:  Good  Suicidal Ideation:  Patient denies any thought, plan, or intent Homicidal Ideation:  Patient denies any thought, plan, or intent  AEB (as evidenced by):  Mental Status Examination/Evaluation: Objective:  Appearance: Disheveled  Eye Contact::  Good  Speech:  Clear and Coherent  Volume:  Normal  Mood:  Anxious and Depressed  Affect:  Congruent  Thought Process:  Linear  Orientation:  Full  Thought Content:  WDL  Suicidal Thoughts:  No  Homicidal Thoughts:  No  Memory:  Immediate;   Good Recent;   Good Remote;   Good  Judgement:  Good  Insight:  Good  Psychomotor Activity:  Normal  Concentration:  Good  Recall:  Good  Akathisia:  No  Handed:    AIMS (if indicated):     Assets:  Communication Skills Desire for Improvement Vocational/Educational  Sleep:  Number of Hours: 5.75    Vital Signs:Blood pressure 142/82, pulse 112, temperature 97.9 F (36.6 C), temperature source Oral, resp. rate 18, height 6' (1.829 m), weight 135.172 kg (298 lb). Current Medications: Current Facility-Administered Medications  Medication Dose  Route Frequency Provider Last Rate Last Dose  . acetaminophen (TYLENOL) tablet 650 mg  650 mg Oral Q6H PRN Rachael Fee, MD      . alum & mag hydroxide-simeth (MAALOX/MYLANTA) 200-200-20 MG/5ML suspension 30 mL  30 mL Oral Q4H PRN Rachael Fee, MD      . ARIPiprazole (ABILIFY) tablet 2 mg  2 mg Oral Daily Rachael Fee, MD   2 mg at 05/09/12 0903  . buPROPion (WELLBUTRIN SR) 12 hr tablet 150 mg  150 mg Oral BID Rachael Fee, MD   150 mg at 05/09/12 0903  . HYDROcodone-acetaminophen (NORCO) 10-325 MG per tablet 1 tablet  1 tablet Oral Q6H PRN Rachael Fee, MD   1 tablet at 05/09/12 0705  . LORazepam (ATIVAN) tablet 1 mg  1 mg Oral Q6H PRN Rachael Fee, MD   1 mg at 05/09/12 0705  . magnesium hydroxide (MILK OF MAGNESIA) suspension 30 mL  30 mL Oral Daily PRN Rachael Fee, MD      . mirtazapine (REMERON) tablet 30 mg  30 mg Oral QHS Rachael Fee, MD   30 mg at 05/08/12 2139  . nicotine (NICODERM CQ - dosed in mg/24 hours) patch 21 mg  21 mg Transdermal Daily Rachael Fee, MD   21 mg at 05/09/12 0903  . prazosin (MINIPRESS) capsule 1 mg  1 mg Oral QHS Jorje Guild, PA-C      . traZODone (DESYREL) tablet 50 mg  50 mg Oral QHS PRN  Rachael Fee, MD   50 mg at 05/07/12 2314  . DISCONTD: doxycycline (VIBRA-TABS) tablet 100 mg  100 mg Oral BID Rachael Fee, MD        Lab Results: No results found for this or any previous visit (from the past 48 hour(s)).  Physical Findings: AIMS: Facial and Oral Movements Muscles of Facial Expression: None, normal Lips and Perioral Area: None, normal Jaw: None, normal Tongue: None, normal,Extremity Movements Upper (arms, wrists, hands, fingers): None, normal Lower (legs, knees, ankles, toes): None, normal, Trunk Movements Neck, shoulders, hips: None, normal, Overall Severity Severity of abnormal movements (highest score from questions above): None, normal Incapacitation due to abnormal movements: None, normal Patient's awareness of abnormal movements (rate  only patient's report): No Awareness, Dental Status Current problems with teeth and/or dentures?: No Does patient usually wear dentures?: No  CIWA:  CIWA-Ar Total: 3  COWS:  COWS Total Score: 3   Treatment Plan Summary: Daily contact with patient to assess and evaluate symptoms and progress in treatment Medication management  Plan: We will try him on prazosin at bedtime to target nightmares. He is recommended that he seek counseling specifically for grief and loss.  Ether Wolters 05/09/2012, 1:40 PM

## 2012-05-09 NOTE — Progress Notes (Signed)
Psychoeducational Group Note  Date:  05/09/2012 Time:  1015  Group Topic/Focus:  Identifying Needs:   The focus of this group is to help patients identify their personal needs that have been historically problematic and identify healthy behaviors to address their needs.  Participation Level: Did Not Attend  Participation Quality:  Not Applicable  Affect:  Not Applicable  Cognitive:  Not Applicable  Insight:  Not Applicable  Engagement in Group: Not Applicable  Additional Comments:    Rich Brave 05/09/2012, 2:42 PM

## 2012-05-09 NOTE — Progress Notes (Signed)
D Pt is seen OOB UAL on the 3oo hall today. He  Is sad, depressed and says he is " hanging in there but today is a bad day...". He makes eye contact and he completes his self inventory and on it he writes he denies SI within the past 24 hrs, he rates his depression and hopelessness "5/5" and he says his DC plan includes " working on it".  A He is taking his medications as planned, attending his groups and he is engaged in his POC. He is processing his problems, trying to understand his feelings and to learn healthier ways to get his needs met.  R Safety is in place and POC includes continuing to  [facility] therapeutic relationship

## 2012-05-10 NOTE — Progress Notes (Signed)
[facility] MD Progress Note  05/10/2012 11:19 AM [name] [name]  MRN:  [id number]  Diagnosis:   Axis I: Adjustment Disorder with Mixed Emotional Features, Bereavement and Major Depression, Recurrent severe Axis II: Deferred Axis III:  Past Medical History  Diagnosis Date  . Epididymitis, bilateral    Subjective: [name] [name] reports that he is doing "okay" today. He does not feel that the groups are particularly helpful for him as he does not feel that they apply to his circumstances. He rates his depression as a 3 on a scale of 1-10, 10 being the worst. He reports that when he woke up he had an anxiety level of 7 of 10, but after taking Ativan, his anxiety is a 2 of 10. He reports that his sleep was poor last night, that he tossed and turned much of the night. He reports a history of having nights like this. He did not have any dreams last night, but feels that his sleep was never deep enough to dream. He endorses a good appetite. He continues to ruminate over the loss of his wife. He denies any suicidal or homicidal ideation. He denies any auditory or visual hallucinations.  ADL's:  Intact  Sleep: Poor  Appetite:  Good  Suicidal Ideation:  Patient denies any thought, plan, or intent Homicidal Ideation:  Patient denies any thought, plan, or intent  AEB (as evidenced by):  Mental Status Examination/Evaluation: Objective:  Appearance: Casual  Eye Contact::  Good  Speech:  Clear and Coherent  Volume:  Normal  Mood:  Anxious and Depressed  Affect:  Congruent  Thought Process:  Linear  Orientation:  Full  Thought Content:  Rumination  Suicidal Thoughts:  No  Homicidal Thoughts:  No  Memory:  Immediate;   Good Recent;   Good Remote;   Good  Judgement:  Good  Insight:  Good  Psychomotor Activity:  Normal  Concentration:  Good  Recall:  Good  Akathisia:  No  Handed:    AIMS (if indicated):     Assets:  Communication Skills Desire for Improvement  Sleep:  Number of Hours: 3.75     Vital Signs:Blood pressure 138/99, pulse 117, temperature 98 F (36.7 C), temperature source Oral, resp. rate 20, height 6' (1.829 m), weight 135.172 kg (298 lb). Current Medications: Current Facility-Administered Medications  Medication Dose Route Frequency Provider Last Rate Last Dose  . acetaminophen (TYLENOL) tablet 650 mg  650 mg Oral Q6H PRN Rachael Fee, MD   325 mg at 05/09/12 1821  . alum & mag hydroxide-simeth (MAALOX/MYLANTA) 200-200-20 MG/5ML suspension 30 mL  30 mL Oral Q4H PRN Rachael Fee, MD      . ARIPiprazole (ABILIFY) tablet 2 mg  2 mg Oral Daily Rachael Fee, MD   2 mg at 05/10/12 0826  . buPROPion (WELLBUTRIN SR) 12 hr tablet 150 mg  150 mg Oral BID Rachael Fee, MD   150 mg at 05/10/12 0826  . HYDROcodone-acetaminophen (NORCO) 10-325 MG per tablet 1 tablet  1 tablet Oral Q6H PRN Rachael Fee, MD   1 tablet at 05/10/12 0504  . LORazepam (ATIVAN) tablet 1 mg  1 mg Oral Q6H PRN Rachael Fee, MD   1 mg at 05/10/12 1052  . magnesium hydroxide (MILK OF MAGNESIA) suspension 30 mL  30 mL Oral Daily PRN Rachael Fee, MD      . mirtazapine (REMERON) tablet 30 mg  30 mg Oral QHS Rachael Fee, MD   30  mg at 05/09/12 2213  . nicotine (NICODERM CQ - dosed in mg/24 hours) patch 21 mg  21 mg Transdermal Daily Rachael Fee, MD   21 mg at 05/10/12 5284  . prazosin (MINIPRESS) capsule 1 mg  1 mg Oral QHS Jorje Guild, PA-C   1 mg at 05/09/12 2213  . traZODone (DESYREL) tablet 50 mg  50 mg Oral QHS PRN Rachael Fee, MD   50 mg at 05/07/12 2314    Lab Results: No results found for this or any previous visit (from the past 48 hour(s)).  Physical Findings: AIMS: Facial and Oral Movements Muscles of Facial Expression: None, normal Lips and Perioral Area: None, normal Jaw: None, normal Tongue: None, normal,Extremity Movements Upper (arms, wrists, hands, fingers): None, normal Lower (legs, knees, ankles, toes): None, normal, Trunk Movements Neck, shoulders, hips: None, normal,  Overall Severity Severity of abnormal movements (highest score from questions above): None, normal Incapacitation due to abnormal movements: None, normal Patient's awareness of abnormal movements (rate only patient's report): No Awareness, Dental Status Current problems with teeth and/or dentures?: No Does patient usually wear dentures?: No  CIWA:  CIWA-Ar Total: 3  COWS:  COWS Total Score: 3   Treatment Plan Summary: Daily contact with patient to assess and evaluate symptoms and progress in treatment Medication management  Plan: We will continue his current plan of care, and research options for followup  [name] [name] 05/10/2012, 11:19 AM

## 2012-05-10 NOTE — Progress Notes (Signed)
Psychoeducational Group Note  Date:  05/10/2012 Time:  1000  Group Topic/Focus:  Wellness Toolbox:   The focus of this group is to discuss various aspects of wellness, balancing those aspects and exploring ways to increase the ability to experience wellness.  Patients will create a wellness toolbox for use upon discharge.  Participation Level:  Active  Participation Quality:  Appropriate, Attentive, Sharing and Supportive  Affect:  Appropriate  Cognitive:  Alert and Appropriate  Insight:  Good  Engagement in Group:  Good  Additional Comments:  Productive group  Earline Mayotte 05/10/2012, 3:43 PM
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
## 2012-05-10 NOTE — Progress Notes (Signed)
Psychoeducational Group Note  Date:  05/10/2012 Time: 1515 Group Topic/Focus:  Making Healthy Choices:   The focus of this group is to help patients identify negative/unhealthy choices they were using prior to admission and identify positive/healthier coping strategies to replace them upon discharge.  Participation Level:  Active  Participation Quality:  Appropriate  Affect:  Appropriate  Cognitive:  Appropriate  Insight:  Good  Engagement in Group:  Good  Additional CommentsCasilda Carls 05/10/2012, 6:58 PM

## 2012-05-10 NOTE — Progress Notes (Signed)
Psychoeducational Group Note  Date:  05/10/2012 Time:  1315  Group Topic/Focus:  Wellness Toolbox:   The focus of this group is to discuss various aspects of wellness, balancing those aspects and exploring ways to increase the ability to experience wellness.  Patients will create a wellness toolbox for use upon discharge.  Participation Level:  Active  Participation Quality:  Appropriate  Affect:  Appropriate  Cognitive:  Alert, Appropriate and Oriented  Insight:  Good  Engagement in Group:  Good  Additional Comments:  Very productive group and supportive of peers   Sandria Senter 05/10/2012, 3:48 PM

## 2012-05-10 NOTE — Progress Notes (Signed)
D:  Patient's self inventory sheet, patient has poor sleep, good appetite, low energy level, improving attention span.  Rated depression and hopelessness #3.  Denied SI.  Denied physical problems.  After discharge, is "working on" how to better care for himself.  No problems taking meds after discharge, A:   Medications given per MD order.  Support and encouragement given throughout day.  Support and safety checks completed as ordered. R:  Following treatment plan.   Denied SI and HI.   Denied A/V hallucinations.  Contracts for safety.  Patient remains safe and receptive on unit. Patient in group room playing games with peers.   Stated he is feeling better today and does not have any pain, medications are working for him.

## 2012-05-11 DIAGNOSIS — F329 Major depressive disorder, single episode, unspecified: Principal | ICD-10-CM

## 2012-05-11 DIAGNOSIS — F4321 Adjustment disorder with depressed mood: Secondary | ICD-10-CM

## 2012-05-11 MED ORDER — ESCITALOPRAM OXALATE 10 MG PO TABS
10.0000 mg | ORAL_TABLET | Freq: Every day | ORAL | Status: DC
  Administered 2012-05-11 – 2012-05-12 (×2): 10 mg via ORAL
  Filled 2012-05-11 (×3): qty 1

## 2012-05-11 MED ORDER — ARIPIPRAZOLE 5 MG PO TABS
5.0000 mg | ORAL_TABLET | Freq: Every day | ORAL | Status: DC
  Administered 2012-05-12 – 2012-05-15 (×4): 5 mg via ORAL
  Filled 2012-05-11 (×2): qty 1
  Filled 2012-05-11: qty 14
  Filled 2012-05-11 (×3): qty 1

## 2012-05-11 MED ORDER — LORAZEPAM 1 MG PO TABS
1.0000 mg | ORAL_TABLET | Freq: Once | ORAL | Status: AC
  Administered 2012-05-11: 1 mg via ORAL
  Filled 2012-05-11: qty 1

## 2012-05-11 MED ORDER — HYDROXYZINE HCL 25 MG PO TABS
25.0000 mg | ORAL_TABLET | Freq: Three times a day (TID) | ORAL | Status: DC | PRN
  Administered 2012-05-11 – 2012-05-14 (×5): 25 mg via ORAL
  Filled 2012-05-11: qty 1
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
## 2012-05-11 NOTE — Progress Notes (Signed)
Patient ID: [name] [name], male   DOB: July 10, 1970, 41 y.o.   MRN: [id number]  Chalmers requested to see me. He watched a Christmas commercial on T.V. He started experiencing a panic attack. He is tearful, not sure why he is having these strong reactions right now.   Plan: Support/help process his feelings          Ativan 1 mg PO NOW          Vistaril 25 mg TID  PRN anxiety  Detria Cummings A. Dub Mikes, M.D.

## 2012-05-11 NOTE — Social Work (Signed)
Patient seen during d/c planning group.  He advised of being okay today but was very disengaged from group.  He denied SI/HI.  Patient later stated when meeting with MD he was focused on the color of the leaves.

## 2012-05-11 NOTE — Social Work (Signed)
[facility] Group Note : Clinical Social Worker Group Therapy  05/11/2012  1:15 PM  Type of Therapy:  Group Therapy  Participation Level:  Appropriate  Participation Quality:  Appropriate   Affect:  Appropriate  Cognitive:  Alert  Insight:  Limited  Engagement in Group:  Limited  Engagement in Therapy:  Limited  Modes of Intervention:  Clarification, Education, Problem-solving, Socialization and Support  Summary of Progress/Problems: The topic for group today was overcoming obstacles.  Pt discussed overcoming obstacles and what this means for pt. Pt was actively listening but did not want to share or participate in group.      Reyes Ivan, LCSWA 05/11/2012 12:51 PM

## 2012-05-11 NOTE — Progress Notes (Signed)
D: Patient denies SI/HI and A/V hallucinations; patient reports sleep to be fair; reports appetite to be good ; reports energy level is normal ; reports ability to pay attention to be improving; rates depression as 2/10; rates hopelessness 2/10;   A: Monitored q 15 minutes; patient encouraged to attend groups; patient educated about medications; patient given medications per physician orders; patient encouraged to express feelings and/or concerns  R: Patient is depressed and get tearful when talking about his deceased wife; patient's interaction with staff and peers is appropriate;; patient is taking medications as prescribed and tolerating medications; patient is attending all groups

## 2012-05-11 NOTE — Progress Notes (Signed)
Psychoeducational Group Note  Date:  05/11/2012 Time: 1100  Group Topic/Focus:  Self Care:   The focus of this group is to help patients understand the importance of self-care in order to improve or restore emotional, physical, spiritual, interpersonal, and financial health.  Participation Level:  Active  Participation Quality:  Appropriate, Attentive and Sharing  Affect:  Appropriate  Cognitive:  Appropriate  Insight:  Good  Engagement in Group:  Good  Additional Comments:  Patient attend group on self care. Patient was asked to define self care in personal terms, and then was given the definition of self care. Patient was given self care assessment from Wellness workbook and rated the sections of physical, psychological, emotional, spiritual, and relationship care of areas that they do well in, their strengths in those area and weakness in areas. Patient stated one area that they would be willing to improve and make a goal for that area   Karleen Hampshire Brittini 05/11/2012, 6:14 PM

## 2012-05-11 NOTE — Progress Notes (Signed)
Patient ID: [name] [name], male   DOB: 1971-06-27, 41 y.o.   MRN: [id number] D)  Came to nsg  Station with c/o pain in his knee, anxiety and insomnia. Was medicated with norco, ativan and trazadone, went back to bed..  Currently resting quietly, resp reg, unlabored, no other c/o's voiced.  A)  Will cobntinue to monitor q 15 minutes for safety.  R)  Safety maintained.

## 2012-05-12 MED ORDER — MIRTAZAPINE 30 MG PO TABS
30.0000 mg | ORAL_TABLET | Freq: Every day | ORAL | Status: DC
  Administered 2012-05-12: 30 mg via ORAL
  Filled 2012-05-12 (×2): qty 1
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
## 2012-05-12 NOTE — Progress Notes (Signed)
[facility] MD Progress Note  05/12/2012 11:07 AM [name] [name]  MRN:  [id number]  Diagnosis:   Axis I: Major Depression, Recurrent severe Axis II: Deferred Axis III:  Past Medical History  Diagnosis Date  . Epididymitis, bilateral    Axis IV: other psychosocial or environmental problems, problems related to social environment and problems with primary support group Axis V: 41-50 serious symptoms  ADL's:  Intact  Sleep: Poor  Appetite:  Good  Suicidal Ideation:  Denies Homicidal Ideation:  Denies  Mental Status Examination/Evaluation: Objective:  Appearance: Casual  Eye Contact::  Good  Speech:  Normal Rate  Volume:  Normal  Mood:  Depressed  Affect:  Congruent, Depressed and Tearful  Thought Process:  Logical  Orientation:  Full  Thought Content:  Rumination  Suicidal Thoughts:  No  Homicidal Thoughts:  No  Memory:  Immediate;   Good Recent;   Good Remote;   Good  Judgement:  Fair  Insight:  Fair  Psychomotor Activity:  Decreased  Concentration:  Fair  Recall:  Fair  Akathisia:  No  Handed:  Right  AIMS (if indicated):     Assets:  Communication Skills Desire for Improvement Housing Physical Health Social Support Transportation Vocational/Educational  Sleep:  Number of Hours: 2.5    Vital Signs:Blood pressure 129/88, pulse 105, temperature 98.4 F (36.9 C), temperature source Oral, resp. rate 16, height 6' (1.829 m), weight 135.172 kg (298 lb). Current Medications: Current Facility-Administered Medications  Medication Dose Route Frequency Provider Last Rate Last Dose  . acetaminophen (TYLENOL) tablet 650 mg  650 mg Oral Q6H PRN Rachael Fee, MD   325 mg at 05/09/12 1821  . alum & mag hydroxide-simeth (MAALOX/MYLANTA) 200-200-20 MG/5ML suspension 30 mL  30 mL Oral Q4H PRN Rachael Fee, MD      . ARIPiprazole (ABILIFY) tablet 5 mg  5 mg Oral Daily Rachael Fee, MD   5 mg at 05/12/12 0102  . buPROPion (WELLBUTRIN SR) 12 hr tablet 150 mg  150 mg Oral BID  Rachael Fee, MD   150 mg at 05/12/12 7253  . escitalopram (LEXAPRO) tablet 10 mg  10 mg Oral Daily Rachael Fee, MD   10 mg at 05/12/12 6644  . HYDROcodone-acetaminophen (NORCO) 10-325 MG per tablet 1 tablet  1 tablet Oral Q6H PRN Rachael Fee, MD   1 tablet at 05/12/12 [phone]  . hydrOXYzine (ATARAX/VISTARIL) tablet 25 mg  25 mg Oral TID PRN Rachael Fee, MD   25 mg at 05/11/12 2241  . LORazepam (ATIVAN) tablet 1 mg  1 mg Oral Q6H PRN Rachael Fee, MD   1 mg at 05/12/12 4259  . [COMPLETED] LORazepam (ATIVAN) tablet 1 mg  1 mg Oral Once Rachael Fee, MD   1 mg at 05/11/12 1514  . magnesium hydroxide (MILK OF MAGNESIA) suspension 30 mL  30 mL Oral Daily PRN Rachael Fee, MD      . nicotine (NICODERM CQ - dosed in mg/24 hours) patch 21 mg  21 mg Transdermal Daily Rachael Fee, MD   21 mg at 05/12/12 0732  . traZODone (DESYREL) tablet 50 mg  50 mg Oral QHS PRN Rachael Fee, MD   50 mg at 05/11/12 2241  . [DISCONTINUED] ARIPiprazole (ABILIFY) tablet 2 mg  2 mg Oral Daily Rachael Fee, MD   2 mg at 05/11/12 0831  . [DISCONTINUED] mirtazapine (REMERON) tablet 30 mg  30 mg Oral QHS Rachael Fee,  MD   30 mg at 05/10/12 2231  . [DISCONTINUED] prazosin (MINIPRESS) capsule 1 mg  1 mg Oral QHS Jorje Guild, PA-C   1 mg at 05/10/12 2231    Lab Results: No results found for this or any previous visit (from the past 48 hour(s)).  Physical Findings: AIMS: Facial and Oral Movements Muscles of Facial Expression: None, normal Lips and Perioral Area: None, normal Jaw: None, normal Tongue: None, normal,Extremity Movements Upper (arms, wrists, hands, fingers): None, normal Lower (legs, knees, ankles, toes): None, normal, Trunk Movements Neck, shoulders, hips: None, normal, Overall Severity Severity of abnormal movements (highest score from questions above): None, normal Incapacitation due to abnormal movements: None, normal Patient's awareness of abnormal movements (rate only patient's report): No  Awareness, Dental Status Current problems with teeth and/or dentures?: No Does patient usually wear dentures?: No  CIWA:  CIWA-Ar Total: 0  COWS:  COWS Total Score: 2   Treatment Plan Summary: Daily contact with patient to assess and evaluate symptoms and progress in treatment Medication management  Plan:  Individual and group therapy, recommended making a memory book with his kids from the first memory of her until her death, discussed mindfulness techniques and ways to take care of himself, plans to attend therapy after discharge  Nanine Means, NP 05/12/2012, 11:07 AM

## 2012-05-12 NOTE — Progress Notes (Signed)
Patient ID: [name] [name], male   DOB: 04-28-1971, 40 y.o.   MRN: [id number] He has been up and to groups interacting with peers and staff. Has requested and receive prn medication  Every 6 hours for anxiety and pain. Self inventory: Depression 2, hopelessness 2,  Denies SI thoughts.

## 2012-05-12 NOTE — Progress Notes (Signed)
[name] [name] Progress Note  05/12/2012 12:35 PM [name] [name]  MRN:  [id number]  Diagnosis:   Axis I: Major Depression, Persistent Complex Bereavement Axis II: Deferred Axis III:  Past Medical History  Diagnosis Date  . Epididymitis, bilateral    Axis IV: problems with primary support group Axis V: 51-60 moderate symptoms  ADL's:  Intact  Sleep: Poor  Appetite:  Fair  Suicidal Ideation:  Plan:  Denies Intent:  Denies Means:  Denies Homicidal Ideation:  Plan:  denies Intent:  Denies Means:  Denies  Did not sleep last night. He went to the loss group today. He met with the chaplain. Not sure how he is going to handle the anniversary tomorrow. He is still becoming tearful when he talks about her. Does not want the kids to see him tearful Mental Status Examination/Evaluation: Objective:  Appearance: Fairly Groomed  Patent attorney::  Fair  Speech:  Normal Rate  Volume:  Normal  Mood:  Depressed  Affect:  Depressed and teary-eyed  Thought Process:  Coherent and Goal Directed  Orientation:  Full  Thought Content:  Rumination and about his wife, the anniversay of her death  Suicidal Thoughts:  No  Homicidal Thoughts:  No  Memory:  Immediate;   Fair Recent;   Fair Remote;   Fair  Judgement:  Fair  Insight:  Fair  Psychomotor Activity:  Normal  Concentration:  Fair  Recall:  Fair  Akathisia:  No  Handed:  Right  AIMS (if indicated):     Assets:  Communication Skills Desire for Improvement Housing Social Support Vocational/Educational  Sleep:  Number of Hours: 2.5    Vital Signs:Blood pressure 129/88, pulse 105, temperature 98.4 F (36.9 C), temperature source Oral, resp. rate 16, height 6' (1.829 m), weight 135.172 kg (298 lb). Current Medications: Current Facility-Administered Medications  Medication Dose Route Frequency Provider Last Rate Last Dose  . acetaminophen (TYLENOL) tablet 650 mg  650 mg Oral Q6H PRN Rachael Fee, [name]   325 mg at 05/09/12 1821  . alum & mag  hydroxide-simeth (MAALOX/MYLANTA) 200-200-20 MG/5ML suspension 30 mL  30 mL Oral Q4H PRN Rachael Fee, [name]      . ARIPiprazole (ABILIFY) tablet 5 mg  5 mg Oral Daily Rachael Fee, [name]   5 mg at 05/12/12 9528  . buPROPion (WELLBUTRIN SR) 12 hr tablet 150 mg  150 mg Oral BID Rachael Fee, [name]   150 mg at 05/12/12 4132  . escitalopram (LEXAPRO) tablet 10 mg  10 mg Oral Daily Rachael Fee, [name]   10 mg at 05/12/12 4401  . HYDROcodone-acetaminophen (NORCO) 10-325 MG per tablet 1 tablet  1 tablet Oral Q6H PRN Rachael Fee, [name]   1 tablet at 05/12/12 [phone]  . hydrOXYzine (ATARAX/VISTARIL) tablet 25 mg  25 mg Oral TID PRN Rachael Fee, [name]   25 mg at 05/12/12 1111  . LORazepam (ATIVAN) tablet 1 mg  1 mg Oral Q6H PRN Rachael Fee, [name]   1 mg at 05/12/12 5366  . [COMPLETED] LORazepam (ATIVAN) tablet 1 mg  1 mg Oral Once Rachael Fee, [name]   1 mg at 05/11/12 1514  . magnesium hydroxide (MILK OF MAGNESIA) suspension 30 mL  30 mL Oral Daily PRN Rachael Fee, [name]      . nicotine (NICODERM CQ - dosed in mg/24 hours) patch 21 mg  21 mg Transdermal Daily Rachael Fee, [name]   21 mg at 05/12/12 0732  .  traZODone (DESYREL) tablet 50 mg  50 mg Oral QHS PRN Rachael Fee, [name]   50 mg at 05/11/12 2241  . [DISCONTINUED] ARIPiprazole (ABILIFY) tablet 2 mg  2 mg Oral Daily Rachael Fee, [name]   2 mg at 05/11/12 0831  . [DISCONTINUED] mirtazapine (REMERON) tablet 30 mg  30 mg Oral QHS Rachael Fee, [name]   30 mg at 05/10/12 2231  . [DISCONTINUED] prazosin (MINIPRESS) capsule 1 mg  1 mg Oral QHS Jorje Guild, PA-C   1 mg at 05/10/12 2231    Lab Results: No results found for this or any previous visit (from the past 48 hour(s)).  Physical Findings: AIMS: Facial and Oral Movements Muscles of Facial Expression: None, normal Lips and Perioral Area: None, normal Jaw: None, normal Tongue: None, normal,Extremity Movements Upper (arms, wrists, hands, fingers): None, normal Lower (legs, knees, ankles, toes): None, normal, Trunk  Movements Neck, shoulders, hips: None, normal, Overall Severity Severity of abnormal movements (highest score from questions above): None, normal Incapacitation due to abnormal movements: None, normal Patient's awareness of abnormal movements (rate only patient's report): No Awareness, Dental Status Current problems with teeth and/or dentures?: No Does patient usually wear dentures?: No  CIWA:  CIWA-Ar Total: 0  COWS:  COWS Total Score: 2   Treatment Plan Summary: Daily contact with patient to assess and evaluate symptoms and progress in treatment Medication management  Plan: D/C Lexapro                  Rsume Remeron 30 mg HS                  Continue to work on grief and loss  [name] [name] 05/12/2012, 12:35 PM

## 2012-05-12 NOTE — Progress Notes (Signed)
[facility] Group Notes:  (Counselor/Nursing/MHT/Case Management/Adjunct)  05/12/2012 12:49 PM  Type of Therapy:  Psychoeducational Skills  Participation Level:  Did Not Attend  Summary of Progress/Problems: [name] [name] did not attend Psychoeducational group that focused on using quality time with support systems/individuals to engage in healthy coping skills.    Wandra Scot 05/12/2012, 12:49 PM

## 2012-05-12 NOTE — Progress Notes (Signed)
[facility] Group Notes:  (Counselor/Nursing/MHT/Case Management/Adjunct)     Feelings About Diagnosis      1:15 -2 :30 PM     Discharge Planning Group  8:30 - 9:30 AM     05/12/2012 2:24 PM  Type of Therapy:  Group Therapy  Participation Level:  Active  Participation Quality:  Appropriate, Attentive and Sharing  Affect:  Appropriate  Cognitive:  Alert and Appropriate  Insight:  Good  Engagement in Group:  Good  Engagement in Therapy:  Good  Modes of Intervention:  Education, Problem-solving, Support and Exploration  Summary of Progress/Problems:.   [name] [name] was easily engaged in group.  He shared feelings of  disbelief that he would become depressed.  He stated he had never accepted depression as something that really happened to people and thoughts other should use more will power.  [name] [name] attended discharge planning group and stated he was offended at RN for suggesting he start dating and that he did not want to share his story with nursing students.  He denies SI/HI.   [name] 05/12/2012, 2:24 PM

## 2012-05-13 MED ORDER — MIRTAZAPINE 30 MG PO TABS
45.0000 mg | ORAL_TABLET | Freq: Every day | ORAL | Status: DC
  Administered 2012-05-13 – 2012-05-14 (×2): 45 mg via ORAL
  Filled 2012-05-13: qty 21
  Filled 2012-05-13 (×3): qty 1

## 2012-05-13 MED ORDER — BUPROPION HCL ER (XL) 300 MG PO TB24
300.0000 mg | ORAL_TABLET | Freq: Every day | ORAL | Status: DC
  Administered 2012-05-13 – 2012-05-15 (×3): 300 mg via ORAL
  Filled 2012-05-13: qty 14
  Filled 2012-05-13 (×4): qty 1

## 2012-05-13 NOTE — Social Work (Signed)
[facility] Group Notes:  (Counselor/Nursing/MHT/Case Management/Adjunct) 05/13/2012 2:34 PM       Emotional Regulation 1:15 - 2:30 PM           Discharge Planning Group 8:30 - 9: 30 AM    Type of Therapy: Group  Participation Level:  Active  Participation Quality:  Attentive  Affect:  Appropriate  Cognitive:  Alert and Appropriate  Insight:  Good  Engagement in Group:  Good  Engagement in Therapy:  Good  Modes of Intervention:  Education, Problem-solving, Support and Exploration  Summary of Progress/Problems:  [name] [name] was able to state how he wants to get better and to stop the sadness from his wife's death.  He shared today is the third anniversary of her death.   [name] 05/13/2012 2:34 PM

## 2012-05-13 NOTE — Progress Notes (Signed)
Patient ID: [name] [name], male   DOB: 1970-07-21, 41 y.o.   MRN: [id number] [facility] MD Progress Note  05/13/2012 12:26 PM Derl A Bressman  MRN:  [id number]  Diagnosis:   Axis I: Major Depression, Recurrent severe Axis II: Deferred Axis III:  Past Medical History  Diagnosis Date  . Epididymitis, bilateral    Axis IV: other psychosocial or environmental problems, problems related to social environment and problems with primary support group Axis V: 41-50 serious symptoms  ADL's:  Intact  Sleep: Poor  Appetite:  Good  Suicidal Ideation:  Denies Homicidal Ideation:  Denies  Mental Status Examination/Evaluation: Objective:  Appearance: Casual  Eye Contact::  Good  Speech:  Normal Rate  Volume:  Normal  Mood:  Depressed  Affect:  Congruent, Depressed and Tearful  Thought Process:  Logical  Orientation:  Full  Thought Content:  Rumination  Suicidal Thoughts:  No  Homicidal Thoughts:  No  Memory:  Immediate;   Good Recent;   Good Remote;   Good  Judgement:  Fair  Insight:  Fair  Psychomotor Activity:  Decreased  Concentration:  Fair  Recall:  Fair  Akathisia:  No  Handed:  Right  AIMS (if indicated):     Assets:  Communication Skills Desire for Improvement Housing Physical Health Social Support Transportation Vocational/Educational  Sleep:  Number of Hours: 4.5    Vital Signs:Blood pressure 131/87, pulse 110, temperature 97.9 F (36.6 C), temperature source Oral, resp. rate 20, height 6' (1.829 m), weight 135.172 kg (298 lb). Current Medications: Current Facility-Administered Medications  Medication Dose Route Frequency Provider Last Rate Last Dose  . acetaminophen (TYLENOL) tablet 650 mg  650 mg Oral Q6H PRN Rachael Fee, MD   325 mg at 05/09/12 1821  . alum & mag hydroxide-simeth (MAALOX/MYLANTA) 200-200-20 MG/5ML suspension 30 mL  30 mL Oral Q4H PRN Rachael Fee, MD      . ARIPiprazole (ABILIFY) tablet 5 mg  5 mg Oral Daily Rachael Fee, MD   5 mg at  05/13/12 0835  . buPROPion (WELLBUTRIN XL) 24 hr tablet 300 mg  300 mg Oral Daily Rachael Fee, MD   300 mg at 05/13/12 1109  . HYDROcodone-acetaminophen (NORCO) 10-325 MG per tablet 1 tablet  1 tablet Oral Q6H PRN Rachael Fee, MD   1 tablet at 05/13/12 1211  . hydrOXYzine (ATARAX/VISTARIL) tablet 25 mg  25 mg Oral TID PRN Rachael Fee, MD   25 mg at 05/13/12 0927  . LORazepam (ATIVAN) tablet 1 mg  1 mg Oral Q6H PRN Rachael Fee, MD   1 mg at 05/13/12 1211  . magnesium hydroxide (MILK OF MAGNESIA) suspension 30 mL  30 mL Oral Daily PRN Rachael Fee, MD      . mirtazapine (REMERON) tablet 45 mg  45 mg Oral QHS Rachael Fee, MD      . nicotine (NICODERM CQ - dosed in mg/24 hours) patch 21 mg  21 mg Transdermal Daily Rachael Fee, MD   21 mg at 05/13/12 7829  . traZODone (DESYREL) tablet 50 mg  50 mg Oral QHS PRN Rachael Fee, MD   50 mg at 05/12/12 2240  . [DISCONTINUED] buPROPion (WELLBUTRIN SR) 12 hr tablet 150 mg  150 mg Oral BID Rachael Fee, MD   150 mg at 05/12/12 1658  . [DISCONTINUED] escitalopram (LEXAPRO) tablet 10 mg  10 mg Oral Daily Rachael Fee, MD   10 mg at 05/12/12 5621  . [  DISCONTINUED] mirtazapine (REMERON) tablet 30 mg  30 mg Oral QHS Rachael Fee, MD   30 mg at 05/12/12 2146    Lab Results: No results found for this or any previous visit (from the past 48 hour(s)).  Physical Findings: AIMS: Facial and Oral Movements Muscles of Facial Expression: None, normal Lips and Perioral Area: None, normal Jaw: None, normal Tongue: None, normal,Extremity Movements Upper (arms, wrists, hands, fingers): None, normal Lower (legs, knees, ankles, toes): None, normal, Trunk Movements Neck, shoulders, hips: None, normal, Overall Severity Severity of abnormal movements (highest score from questions above): None, normal Incapacitation due to abnormal movements: None, normal Patient's awareness of abnormal movements (rate only patient's report): No Awareness, Dental  Status Current problems with teeth and/or dentures?: No Does patient usually wear dentures?: No  CIWA:  CIWA-Ar Total: 0  COWS:  COWS Total Score: 2   Treatment Plan Summary: Daily contact with patient to assess and evaluate symptoms and progress in treatment Medication management  Plan:  Individual and group therapy, anniversary of his wife's death today, patient has been weepy and depressed, mindfulness based cognitive therapy encouraged and will be part of the afternoon group, patient encouraged to use and information on different techniques given.  Nanine Means, NP 05/13/2012, 12:26 PM
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
## 2012-05-13 NOTE — Progress Notes (Signed)
Psychoeducational Group Note  Date:  05/13/2012 Time:  2000  Group Topic/Focus:  Wrap-Up Group:   The focus of this group is to help patients review their daily goal of treatment and discuss progress on daily workbooks.  Participation Level:  Active  Participation Quality:  Appropriate  Affect:  Appropriate  Cognitive:  Appropriate  Insight:  Good  Engagement in Group:  Good  Additional Comments:  Patient was appropriate during group. Patient was attentive and sharing.  Lyndee Hensen 05/13/2012, 5:37 AM
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
## 2012-05-13 NOTE — Progress Notes (Signed)
Pt laying in bed resting with eyes closed. Respirations even and unlabored. No distress noted.  

## 2012-05-13 NOTE — Tx Team (Signed)
Interdisciplinary Treatment Plan Update (Adult)  Date:  05/13/2012  Time Reviewed:  9:36 AM   Progress in Treatment: Attending groups:   Yes   Participating in groups:  Yes Taking medication as prescribed:  Yes Tolerating medication:  Yes Family/Significant othe contact made: Declined to allow contact with family Patient understands diagnosis:  Yes Discussing patient identified problems/goals with staff: Yes Medical problems stabilized or resolved: Yes Denies suicidal/homicidal ideation:Yes Issues/concerns per patient self-inventory:  Other:  New problem(s) identified: N/A  Reason for Continuation of Hospitalization: Anxiety Depression Medication stabilization   Interventions implemented related to continuation of hospitalization:  Medication Management; safety checks q 15 mins  Additional comments: Making changes with his meds to stabilize his mood  Estimated length of stay: 1-2 days  Discharge Plan:  Home with outpatient follow up  New goal(s):  Review of initial/current patient goals per problem list:    1.  Goal(s): Eliminate SI/other thoughts of self harm   Met:  Yes  Target date: d/c  As evidenced by: Patient no longer endorsing SI/HI or other thoughts of self harm.    2.  Goal (s):Reduce depression/anxiety  Met: Yes  Target date: d/c  As evidenced by: Patient currently rating symptoms at four or below    3.  Goal(s):.stabilize on meds   Met:  No  Target date: d/c  As evidenced by: Patient reports being stabilized on medications - less symptomatic    4.  Goal(s): Refer for outpatient follow up   Met:  Yes  Target date: d/c  As evidenced by: Follow up appointment scheduled Attendees: Patient:     Family:     Physician: Geoffery Lyons, MD 05/13/2012 9:55 AM   Nursing: Roswell Miners, RN 05/13/2012 9:55 AM   Clinical Social Worker:  Reyes Ivan, LCSWA 05/13/2012  9:55 AM   Other: Dennison Nancy, RN 05/13/2012  9:55 AM   Other:  Nanine Means, NP 05/13/2012 9:58 AM   Other:  Liborio Nixon, RN 05/13/2012 9:59 AM   Other:  Bubba Camp, Psyc intern 05/13/2012 9:59 AM   Other:

## 2012-05-13 NOTE — Progress Notes (Signed)
Patient ID: [name] [name], male   DOB: 1970-10-27, 41 y.o.   MRN: [id number] D- Patient reports his depression and hopelessness are at level 3 however, patient has been tearful when discussing that  today is the anniverary date of his wife's death. " I always have knee pain." Denies SI/HI/Av hall   A- Mood is depressed and anxious. C/o of knee pain medicated with Ativan and norco  patient reports slight relief. Attempted to encourage patient to try alternatives to meds. Patient refuse.  R_Continue to monitor on 15 minute checks for safety and offer emotional support.

## 2012-05-14 MED ORDER — QUETIAPINE FUMARATE 100 MG PO TABS
100.0000 mg | ORAL_TABLET | Freq: Every day | ORAL | Status: DC
  Administered 2012-05-14: 100 mg via ORAL
  Filled 2012-05-14 (×3): qty 1

## 2012-05-14 NOTE — Progress Notes (Signed)
[facility] MD Progress Note  05/14/2012 9:55 AM Mykal A Mcmaster  MRN:  [id number]  Diagnosis:   Axis I: Major Depression,  Persistent Complex Grief Disorder Axis II: Deferred Axis III:  Past Medical History  Diagnosis Date  . Epididymitis, bilateral    Axis IV: loss of his wife Axis V: 51-60 moderate symptoms  ADL's:  Intact  Sleep: Poor  Appetite:  Fair  Suicidal Ideation:  Plan:  No plans Intent:  Denies Means:  Denies Homicidal Ideation:  Plan:  Denies Intent:  Denies Means:  Denies  Yesterday evening experienced increase memories about his wife, he cried most of the evening, sleep was interrupted, did not get more than one and a half at a time. He had dreams about his wife, woke him up over and over again. This AM he is tired. Going back and forth the what if's, the should's. He spoke with his oldest child and had to talk him through his own pain, missing his mother Depression: 9 Anxiety: 6   Mental Status Examination/Evaluation: Objective:  Appearance: Fairly Groomed  Patent attorney::  Fair  Speech:  Clear and Coherent  Volume:  Decreased  Mood:  Depressed  Affect:  Tearful  Thought Process:  Coherent and Goal Directed  Orientation:  Full  Thought Content:  Thoughts about his wife, as sense of hopelessness, helplessness  Suicidal Thoughts:  He would not do anything because of his kids  Homicidal Thoughts:  No  Memory:  Immediate;   Fair Recent;   Fair Remote;   Fair  Judgement:  Fair  Insight:  Present  Psychomotor Activity:  Decreased  Concentration:  Poor  Recall:  Fair  Akathisia:  No  Handed:  Right  AIMS (if indicated):     Assets:  Communication Skills Desire for Improvement Housing  Sleep:  Number of Hours: 5    Vital Signs:Blood pressure 151/96, pulse 102, temperature 97.2 F (36.2 C), temperature source Oral, resp. rate 18, height 6' (1.829 m), weight 135.172 kg (298 lb). Current Medications: Current Facility-Administered Medications  Medication  Dose Route Frequency Provider Last Rate Last Dose  . acetaminophen (TYLENOL) tablet 650 mg  650 mg Oral Q6H PRN Rachael Fee, MD   650 mg at 05/14/12 0600  . alum & mag hydroxide-simeth (MAALOX/MYLANTA) 200-200-20 MG/5ML suspension 30 mL  30 mL Oral Q4H PRN Rachael Fee, MD      . ARIPiprazole (ABILIFY) tablet 5 mg  5 mg Oral Daily Rachael Fee, MD   5 mg at 05/14/12 0746  . buPROPion (WELLBUTRIN XL) 24 hr tablet 300 mg  300 mg Oral Daily Rachael Fee, MD   300 mg at 05/14/12 0747  . HYDROcodone-acetaminophen (NORCO) 10-325 MG per tablet 1 tablet  1 tablet Oral Q6H PRN Rachael Fee, MD   1 tablet at 05/14/12 0746  . hydrOXYzine (ATARAX/VISTARIL) tablet 25 mg  25 mg Oral TID PRN Rachael Fee, MD   25 mg at 05/13/12 0927  . LORazepam (ATIVAN) tablet 1 mg  1 mg Oral Q6H PRN Rachael Fee, MD   1 mg at 05/14/12 0746  . magnesium hydroxide (MILK OF MAGNESIA) suspension 30 mL  30 mL Oral Daily PRN Rachael Fee, MD      . mirtazapine (REMERON) tablet 45 mg  45 mg Oral QHS Rachael Fee, MD   45 mg at 05/13/12 2222  . nicotine (NICODERM CQ - dosed in mg/24 hours) patch 21 mg  21 mg Transdermal  Daily Rachael Fee, MD   21 mg at 05/14/12 0602  . traZODone (DESYREL) tablet 50 mg  50 mg Oral QHS PRN Rachael Fee, MD   50 mg at 05/12/12 2240    Lab Results: No results found for this or any previous visit (from the past 48 hour(s)).  Physical Findings: AIMS: Facial and Oral Movements Muscles of Facial Expression: None, normal Lips and Perioral Area: None, normal Jaw: None, normal Tongue: None, normal,Extremity Movements Upper (arms, wrists, hands, fingers): None, normal Lower (legs, knees, ankles, toes): None, normal, Trunk Movements Neck, shoulders, hips: None, normal, Overall Severity Severity of abnormal movements (highest score from questions above): None, normal Incapacitation due to abnormal movements: None, normal Patient's awareness of abnormal movements (rate only patient's report): No  Awareness, Dental Status Current problems with teeth and/or dentures?: No Does patient usually wear dentures?: No  CIWA:  CIWA-Ar Total: 3  COWS:  COWS Total Score: 2   Treatment Plan Summary: Daily contact with patient to assess and evaluate symptoms and progress in treatment Medication management  Plan: Supportive approach/coping skills/grief loss           Improve sleep           Will try Seroquel short term at bedtime  Ravis Herne A 05/14/2012, 9:55 AM

## 2012-05-14 NOTE — Progress Notes (Signed)
Patient ID: [name] [name], male   DOB: January 26, 1971, 41 y.o.   MRN: [id number]  D: Pt interacts well with his peers, however his demeanor changed when  approached by the writer. Pt ask the writer about his ativan and norco. Then pt stated, "I know it's way too early for it now". Writer informed pt of time he would be able to get his prns.  A: Support and encouragement was offered. 15 min checks continued for safety.  R: Pt remains safe.

## 2012-05-14 NOTE — Progress Notes (Signed)
Did not attend group 

## 2012-05-14 NOTE — Progress Notes (Signed)
Psychoeducational Group Note  Date:  05/14/2012 Time: 1100  Group Topic/Focus:  Building Self Esteem:   The Focus of this group is helping patients become aware of the effects of self-esteem on their lives, the things they and others do that enhance or undermine their self-esteem, seeing the relationship between their level of self-esteem and the choices they make and learning ways to enhance self-esteem.  Participation Level:  Minimal  Participation Quality:  Appropriate and Attentive  Affect:  Appropriate  Cognitive:  Appropriate  Insight:  Good  Engagement in Group:  Limited  Additional Comments:   Patient participated and shared in group when asked to speak. Patient shared the definition of self-esteem in own terms and gave information on positive and negative self esteem in ways that affect patient internally, physically, emotional and externally. After talking about the influences of self-esteem, patient was given a handout on self-esteem and a worksheet that patient during the group.   Karleen Hampshire Brittini 05/14/2012, 7:08 PM

## 2012-05-14 NOTE — Progress Notes (Signed)
Patient ID: [name] [name], male   DOB: 07-17-1970, 41 y.o.   MRN: [id number] He has been up and about and to group.  Requested and received prn this am for pain and anxiety. Self inventory:  Depression and  hopeless at 2, denies SI thoughts. Has been interacting with peers and affect in brighter but when staff  interacts with him his affect is flat.

## 2012-05-14 NOTE — Progress Notes (Signed)
D: Patient has been up and has been interacting appropriately with staff and peers this evening, pt has been attending and participating in various milieu activities, pt has complained of pain, not being able to sleep as he has been having vivid dreams of his deceased wife and also depression  A: Pt has received all medications without incident, support and encouragement provided.  R: Will continue to monitor.

## 2012-05-14 NOTE — Progress Notes (Signed)
Psychoeducational Group Note  Date:  05/14/2012 Time:  1000  Group Topic/Focus:  Goals Group:   The focus of this group is to help patients establish daily goals to achieve during treatment and discuss how the patient can incorporate goal setting into their daily lives to aide in recovery.  Participation Level:  Active  Participation Quality:  Appropriate, Attentive, Sharing and Supportive  Affect:  Depressed  Cognitive:  Alert, Appropriate and Oriented  Insight:  Good  Engagement in Group:  Good  Additional Comments:  Patient stated goal for today is "to not tear up." Patient verbalizes his mother is his main support person when he is home.   [name] [name] 05/14/2012, 11:38 AM

## 2012-05-15 MED ORDER — TRAZODONE HCL 50 MG PO TABS: 50.0000 mg | ORAL_TABLET | Freq: Every evening | ORAL | Status: DC | PRN

## 2012-05-15 MED ORDER — BUPROPION HCL ER (XL) 300 MG PO TB24: 300.0000 mg | ORAL_TABLET | Freq: Every day | ORAL | Status: DC

## 2012-05-15 MED ORDER — HYDROCODONE-ACETAMINOPHEN 10-325 MG PO TABS: 1.0000 | ORAL_TABLET | Freq: Four times a day (QID) | ORAL | Status: DC | PRN

## 2012-05-15 MED ORDER — MIRTAZAPINE 45 MG PO TABS: 45.0000 mg | ORAL_TABLET | Freq: Every day | ORAL | Status: DC

## 2012-05-15 MED ORDER — ARIPIPRAZOLE 5 MG PO TABS: 5.0000 mg | ORAL_TABLET | Freq: Every day | ORAL | Status: DC

## 2012-05-15 MED ORDER — NICOTINE 21 MG/24HR TD PT24: 1.0000 | MEDICATED_PATCH | Freq: Every day | TRANSDERMAL | Status: DC

## 2012-05-15 NOTE — Discharge Summary (Signed)
Physician Discharge Summary Note  Patient:  [name] [name] is an 41 y.o., male MRN:  [id number] DOB:  [birth year] Patient phone:  [phone] (home)  Patient address:   [address] [place] [address],   Date of Admission:  05/07/2012 Date of Discharge: 05/15/2012  Reason for Admission: MDD severe with opiate abuse  Discharge Diagnoses: Principal Problem:  *Major depression Active Problems:  Complicated grief  Axis Diagnosis:  Discharge Diagnoses:  AXIS I: Major Depression, single episode. Persistent Complex Bereavement Disorder  AXIS II: Deferred  AXIS III:  Past Medical History   Diagnosis  Date   .  Epididymitis, bilateral    AXIS IV: Loss of relationship  AXIS V: 61-70 mild symptoms  Level of Care:  OP  Hospital Course:  Kameron was admitted for crisis management and stabilization. He presented to the ED reporting increasing depression with the feeling that he would be better off dead.  He was admitted and treated for symptoms of major depression and complicated grief which included sadness, decreased energy, guilt, helplessness and hopelessness, disturbed sleep, panic attacks excessive worry, and anhedonia. Consults:  None  Significant Diagnostic Studies:  None  Discharge Vitals:   Blood pressure 132/86, pulse 105, temperature 97.5 F (36.4 C), temperature source Oral, resp. rate 20, height 6' (1.829 m), weight 135.172 kg (298 lb). Lab Results:   No results found for this or any previous visit (from the past 72 hour(s)).  Physical Findings: AIMS: Facial and Oral Movements Muscles of Facial Expression: None, normal Lips and Perioral Area: None, normal Jaw: None, normal Tongue: None, normal,Extremity Movements Upper (arms, wrists, hands, fingers): None, normal Lower (legs, knees, ankles, toes): None, normal, Trunk Movements Neck, shoulders, hips: None, normal, Overall Severity Severity of abnormal movements (highest score from questions above): None,  normal Incapacitation due to abnormal movements: None, normal Patient's awareness of abnormal movements (rate only patient's report): No Awareness, Dental Status Current problems with teeth and/or dentures?: No Does patient usually wear dentures?: No  CIWA:  CIWA-Ar Total: 0  COWS:  COWS Total Score: 0   Mental Status Exam: See Mental Status Examination and Suicide Risk Assessment completed by Attending Physician prior to discharge.  Discharge destination:  Home  Is patient on multiple antipsychotic therapies at discharge:  No   Has Patient had three or more failed trials of antipsychotic monotherapy by history:  No  Recommended Plan for Multiple Antipsychotic Therapies: N/A  Discharge Orders    Future Orders Please Complete By Expires   Diet - low sodium heart healthy      Increase activity slowly      Discharge instructions      Comments:   Take all of your medications as prescribed.  Be sure to keep ALL follow up appointments as scheduled. This is to ensure getting your refills on time to avoid any interruption in your medication.  If you find that you can not keep your appointment, call the clinic and reschedule. Be sure to tell the nurse if you will need a refill before your appointment.       Medication List     As of 05/15/2012 11:56 AM    STOP taking these medications         acetaminophen 500 MG tablet   Commonly known as: TYLENOL      buPROPion 100 MG tablet   Commonly known as: WELLBUTRIN      diazepam 5 MG tablet   Commonly known as: VALIUM  doxycycline 100 MG capsule   Commonly known as: VIBRAMYCIN      HYDROcodone-acetaminophen 10-500 MG per tablet   Commonly known as: LORTAB      oxyCODONE-acetaminophen 5-325 MG per tablet   Commonly known as: PERCOCET/ROXICET      TAKE these medications      Indication    ARIPiprazole 5 MG tablet   Commonly known as: ABILIFY   Take 1 tablet (5 mg total) by mouth daily.    Indication: Major Depressive  Disorder      buPROPion 300 MG 24 hr tablet   Commonly known as: WELLBUTRIN XL   Take 1 tablet (300 mg total) by mouth daily.    Indication: Major Depressive Disorder      HYDROcodone-acetaminophen 10-325 MG per tablet   Commonly known as: NORCO   Take 1 tablet by mouth every 6 (six) hours as needed.    Indication: Moderate to Moderately Severe Pain      mirtazapine 45 MG tablet   Commonly known as: REMERON   Take 1 tablet (45 mg total) by mouth at bedtime.    Indication: Trouble Sleeping, Major Depressive Disorder      nicotine 21 mg/24hr patch   Commonly known as: NICODERM CQ - dosed in mg/24 hours   Place 1 patch onto the skin daily.    Indication: Nicotine Addiction      traZODone 50 MG tablet   Commonly known as: DESYREL   Take 1 tablet (50 mg total) by mouth at bedtime as needed for sleep.    Indication: Trouble Sleeping           Follow-up Information    Follow up with [facility] . On 05/19/2012. (You are scheduled with [facility] on Tuesday, May 19, 2012 between 8:30 -11AM)    Contact information:   [address] [place], [place]  [address]  [phone]      Please follow up.   Contact information:             Follow-up recommendations:  As noted above  Comments:    Signed:  Lloyd Huger T. Virginio Isidore PAC 05/15/2012, 11:56 AM

## 2012-05-15 NOTE — Progress Notes (Signed)
[facility] Case Management Discharge Plan:  Will you be returning to the same living situation after discharge: Yes,  Patient to return to his home At discharge, do you have transportation home?:Yes,  Family will transport patient home Do you have the ability to pay for your medications:No.Patient to be assisted with indigent medications  Interagency Information:     Release of information consent forms completed and in the chart;  Patient's signature needed at discharge.  Patient to Follow up at:  Follow-up Information    Follow up with [facility] . On 05/19/2012. (You are scheduled with [facility] on Tuesday, May 19, 2012 between 8:30 -11AM)    Contact information:   [address] [place], [place]  [address]  [phone]         Patient denies SI/HI:   Yes,  Patient is no longer endorsing SI/HI or other thoughts of self harm    Safety Planning and Suicide Prevention discussed:  Yes,  Reviewed during aftercare groups  Barrier to discharge identified:Yes,  Limited Support system  Summary and Recommendations: Patient encouraged to be compliant with medications and follow up with outpatient recommendations '   [name] 05/15/2012, 11:30 AM

## 2012-05-15 NOTE — Progress Notes (Signed)
Nsg d/c note- Patient appropriate during d/c process. Reviewed all f/u instructions and medication information. Denies SI. States all treatment goals were met. All belongings returned and escorted to lobby to care of family.

## 2012-05-15 NOTE — [facility] Suicide Risk Assessment (Signed)
Suicide Risk Assessment  Discharge Assessment     Demographic Factors:  Divorced or widowed and Caucasian  Mental Status Per Nursing Assessment::   On Admission:     Current Mental Status by Physician: In full contact with reality.There are no suicidal ideas, plans or intent. His mood is improved, his affect is brighter, broader. He has dealt with the grief, the loss of his wife. He has accepted that his wife is dead and that she is not coming back. He is trying to put together a plan of how to move on. He is focusing on his children. He is going to decrease his isolation, reach out to friends. Build a support system.  Loss Factors: Loss of significant relationship  Historical Factors: Anniversary of important loss and NA  Risk Reduction Factors:   Responsible for children under 14 years of age, Sense of responsibility to family, Employed and Living with another person, especially a relative  Continued Clinical Symptoms:  Depression improved  Cognitive Features That Contribute To Risk: None  Suicide Risk:  Minimal: No identifiable suicidal ideation.  Patients presenting with no risk factors but with morbid ruminations; may be classified as minimal risk based on the severity of the depressive symptoms  Discharge Diagnoses:   AXIS I:  Major Depression, single episode. Persistent Complex Bereavement Disorder AXIS II:  Deferred AXIS III:   Past Medical History  Diagnosis Date  . Epididymitis, bilateral    AXIS IV:  Loss of relationship AXIS V:  61-70 mild symptoms  Plan Of Care/Follow-up recommendations:  Activity:  Exercise Diet:  Regular/portion control/drop the sodas Will follow up with outpatient therapy  Is patient on multiple antipsychotic therapies at discharge:  No   Has Patient had three or more failed trials of antipsychotic monotherapy by history:  No  Recommended Plan for Multiple Antipsychotic Therapies: N/A  Griffin Dewilde A 05/15/2012, 10:48 AM

## 2012-05-15 NOTE — Progress Notes (Signed)
Psychoeducational Group Note  Date:  05/15/2012 Time:  1315  Group Topic/Focus:  Relapse Prevention Planning:   The focus of this group is to define relapse and discuss the need for planning to combat relapse.  Participation Level:  None  Participation Quality:  Appropriate and Attentive  Affect:  Appropriate  Cognitive:  Alert and Appropriate  Insight:  None  Engagement in Group:  None  Additional Comments:  Pt attended group but did not participate in group discussion.  Dalia Heading 05/15/2012, 4:36 PM

## 2012-05-15 NOTE — Social Work (Signed)
Patient seen during discharge planning group and after.  He shared that he will go to [facility] at discharge but plan to find a private therapist on his own.  He also declined referral to Hospice stating his mother had problems with Hospice staff in the past.

## 2012-05-15 NOTE — Progress Notes (Signed)
Patient did attend the evening karaoke group. Patient participated by singing a song.  

## 2012-05-17 NOTE — Progress Notes (Signed)
Agree with assessment and plan Nadia Torr A. Angelamarie Avakian, M.D. 

## 2012-05-17 NOTE — Progress Notes (Signed)
Agree with assessment and plan Zorah Backes A. Manu Rubey, M.D. 

## 2012-05-20 NOTE — Progress Notes (Signed)
Patient Discharge Instructions:  After Visit Summary (AVS):   Faxed to:  05/20/12 Psychiatric Admission Assessment Note:   Faxed to:  05/20/12 Suicide Risk Assessment - Discharge Assessment:   Faxed to:  05/20/12 Faxed/Sent to the Next Level Care provider:  05/20/12 Faxed to [facility] @ [phone]  Jerelene Redden, 05/20/2012, 3:30 PM

## 2012-05-31 NOTE — Discharge Summary (Signed)
Agree with assessment and plan Tanae Petrosky A. Jayceon Troy, M.D. 

## 2012-10-27 ENCOUNTER — Encounter (HOSPITAL_COMMUNITY): Payer: Self-pay | Admitting: Emergency Medicine

## 2012-10-27 ENCOUNTER — Emergency Department (HOSPITAL_COMMUNITY)
Admission: EM | Admit: 2012-10-27 | Discharge: 2012-10-27 | Disposition: A | Discharge status: Home / Self Care | Payer: Self-pay | Attending: Emergency Medicine | Admitting: Emergency Medicine

## 2012-10-27 DIAGNOSIS — Z8719 Personal history of other diseases of the digestive system: Secondary | ICD-10-CM | POA: Insufficient documentation

## 2012-10-27 DIAGNOSIS — H53149 Visual discomfort, unspecified: Secondary | ICD-10-CM | POA: Insufficient documentation

## 2012-10-27 DIAGNOSIS — R110 Nausea: Secondary | ICD-10-CM | POA: Insufficient documentation

## 2012-10-27 DIAGNOSIS — Z87448 Personal history of other diseases of urinary system: Secondary | ICD-10-CM | POA: Insufficient documentation

## 2012-10-27 DIAGNOSIS — F17200 Nicotine dependence, unspecified, uncomplicated: Secondary | ICD-10-CM | POA: Insufficient documentation

## 2012-10-27 DIAGNOSIS — G43909 Migraine, unspecified, not intractable, without status migrainosus: Secondary | ICD-10-CM | POA: Insufficient documentation

## 2012-10-27 HISTORY — DX: Acute pancreatitis without necrosis or infection, unspecified: K85.90

## 2012-10-27 MED ORDER — FENTANYL CITRATE 0.05 MG/ML IJ SOLN
100.0000 ug | Freq: Once | INTRAMUSCULAR | Status: AC
  Administered 2012-10-27: 100 ug via INTRAVENOUS
  Filled 2012-10-27: qty 2

## 2012-10-27 MED ORDER — SUMATRIPTAN SUCCINATE 6 MG/0.5ML ~~LOC~~ SOLN
6.0000 mg | Freq: Once | SUBCUTANEOUS | Status: AC
  Administered 2012-10-27: 6 mg via SUBCUTANEOUS
  Filled 2012-10-27: qty 0.5

## 2012-10-27 MED ORDER — SODIUM CHLORIDE 0.9 % IV BOLUS (SEPSIS)
500.0000 mL | Freq: Once | INTRAVENOUS | Status: AC
  Administered 2012-10-27: 500 mL via INTRAVENOUS

## 2012-10-27 MED ORDER — ONDANSETRON HCL 4 MG/2ML IJ SOLN
4.0000 mg | Freq: Once | INTRAMUSCULAR | Status: AC
  Administered 2012-10-27: 4 mg via INTRAVENOUS
  Filled 2012-10-27: qty 2

## 2012-10-27 MED ORDER — KETOROLAC TROMETHAMINE 30 MG/ML IJ SOLN
30.0000 mg | Freq: Once | INTRAMUSCULAR | Status: AC
  Administered 2012-10-27: 30 mg via INTRAVENOUS
  Filled 2012-10-27: qty 1

## 2012-10-27 NOTE — ED Notes (Signed)
Pt states headache since Sunday.denies history of same. Sensitive to light. Nausea but no vomiting

## 2012-10-27 NOTE — ED Notes (Signed)
Pt states that he feels better, is ready to go home, Dr. Radford Pax notified,

## 2012-10-27 NOTE — ED Provider Notes (Signed)
History  This chart was scribed for [name] Shi, MD by Ardeen Jourdain, ED Scribe. This patient was seen in room APA10/APA10 and the patient's care was started at 1003.  CSN: [id number]  Arrival date & time 10/27/12  4098   First MD Initiated Contact with Patient 10/27/12 1003      Chief Complaint  Patient presents with  . Headache    The history is provided by the patient. No language interpreter was used.    [name] is a 42 y.o. male who presents to the Emergency Department complaining of gradual onset, gradually worsening, constant HA with associated photophobia and nausea that began 3 days ago. Pt locates the pain to the front. He describes the pain as a throbbing sensation. He states Sunday night he experienced "flashing in both eyes." Pt denies any emesis as an associated symptom. Pt denies any h/o similar HA. Pt reports taking Tylenol with no relief.    Past Medical History  Diagnosis Date  . Epididymitis, bilateral   . Pancreatitis, acute     Past Surgical History  Procedure Laterality Date  . Cholecystectomy    . Knee surgery      History reviewed. No pertinent family history.  History  Substance Use Topics  . Smoking status: Current Every Day Smoker  . Smokeless tobacco: Not on file  . Alcohol Use: No      Review of Systems  Constitutional: Negative for fever and chills.  Respiratory: Negative for shortness of breath.   Gastrointestinal: Positive for nausea. Negative for vomiting and diarrhea.  Neurological: Positive for headaches. Negative for weakness.  All other systems reviewed and are negative.    Allergies  Zolpidem tartrate  Home Medications   Current Outpatient Rx  Name  Route  Sig  Dispense  Refill  . acetaminophen (TYLENOL) 500 MG tablet   Oral   Take 1,000 mg by mouth every 6 (six) hours as needed for pain.           Triage Vitals: BP 147/92  Temp(Src) 97.8 F (36.6 C) (Oral)  Resp 18  Ht 6' (1.829 m)  Wt 250 lb  (113.399 kg)  BMI 33.9 kg/m2  SpO2 96%  Physical Exam  Nursing note and vitals reviewed. Constitutional: He is oriented to person, place, and time. He appears well-developed and well-nourished. No distress.  HENT:  Head: Normocephalic and atraumatic.  Eyes: Pupils are equal, round, and reactive to light.  Patient photophobic  Neck: Normal range of motion.  Cardiovascular: Normal rate and intact distal pulses.   Pulmonary/Chest: No respiratory distress.  Abdominal: Normal appearance. He exhibits no distension.  Musculoskeletal: Normal range of motion.  Neurological: He is alert and oriented to person, place, and time. He has normal strength. No cranial nerve deficit. Coordination and gait normal. GCS eye subscore is 4. GCS verbal subscore is 5. GCS motor subscore is 6.  Skin: Skin is warm and dry. No rash noted.  Psychiatric: He has a normal mood and affect. His behavior is normal.    ED Course  Procedures (including critical care time) Meds ordered this encounter  Medications  . sodium chloride 0.9 % bolus 500 mL    Sig:   . ondansetron (ZOFRAN) injection 4 mg    Sig:   . fentaNYL (SUBLIMAZE) injection 100 mcg    Sig:   . ketorolac (TORADOL) 30 MG/ML injection 30 mg    Sig:   . SUMAtriptan (IMITREX) injection 6 mg    Sig:   .  acetaminophen (TYLENOL) 500 MG tablet    Sig: Take 1,000 mg by mouth every 6 (six) hours as needed for pain.    DIAGNOSTIC STUDIES: Oxygen Saturation is 96% on room air, adequate by my interpretation.    COORDINATION OF CARE:  10:19 AM-Discussed treatment plan which includes IV fluids and pain medication with pt at bedside and pt agreed to plan.    Labs Reviewed - No data to display No results found.   1. Migraine       MDM  I personally performed the services described in this documentation, which was scribed in my presence. The recorded information has been reviewed and considered.  After treatment in the ED the patient feels back to  baseline and wants to go home.   [name] Shi, MD 10/27/12 1146

## 2013-05-16 ENCOUNTER — Emergency Department (HOSPITAL_COMMUNITY)
Admission: EM | Admit: 2013-05-16 | Discharge: 2013-05-16 | Disposition: A | Discharge status: Home / Self Care | Payer: Self-pay | Attending: Emergency Medicine | Admitting: Emergency Medicine

## 2013-05-16 ENCOUNTER — Emergency Department (HOSPITAL_COMMUNITY): Payer: Self-pay

## 2013-05-16 ENCOUNTER — Encounter (HOSPITAL_COMMUNITY): Payer: Self-pay | Admitting: Emergency Medicine

## 2013-05-16 DIAGNOSIS — Z8719 Personal history of other diseases of the digestive system: Secondary | ICD-10-CM | POA: Insufficient documentation

## 2013-05-16 DIAGNOSIS — F17200 Nicotine dependence, unspecified, uncomplicated: Secondary | ICD-10-CM | POA: Insufficient documentation

## 2013-05-16 DIAGNOSIS — R000 Tachycardia, unspecified: Secondary | ICD-10-CM | POA: Insufficient documentation

## 2013-05-16 DIAGNOSIS — Z79899 Other long term (current) drug therapy: Secondary | ICD-10-CM | POA: Insufficient documentation

## 2013-05-16 DIAGNOSIS — R110 Nausea: Secondary | ICD-10-CM | POA: Insufficient documentation

## 2013-05-16 DIAGNOSIS — N419 Inflammatory disease of prostate, unspecified: Secondary | ICD-10-CM | POA: Insufficient documentation

## 2013-05-16 LAB — CBC WITH DIFFERENTIAL/PLATELET
Eosinophils Absolute: 0 10*3/uL (ref 0.0–0.7)
Eosinophils Relative: 0 % (ref 0–5)
Monocytes Absolute: 0.6 10*3/uL (ref 0.1–1.0)
Monocytes Relative: 4 % (ref 3–12)
Neutro Abs: 9.2 10*3/uL — ABNORMAL HIGH (ref 1.7–7.7)
Neutrophils Relative %: 62 % (ref 43–77)
Platelets: 336 10*3/uL (ref 150–400)
RBC: 4.48 MIL/uL (ref 4.22–5.81)
WBC: 14.8 10*3/uL — ABNORMAL HIGH (ref 4.0–10.5)
nRBC: 0 /100 WBC

## 2013-05-16 LAB — COMPREHENSIVE METABOLIC PANEL
Albumin: 3.9 g/dL (ref 3.5–5.2)
BUN: 7 mg/dL (ref 6–23)
Creatinine, Ser: 0.87 mg/dL (ref 0.50–1.35)
Total Bilirubin: 0.2 mg/dL — ABNORMAL LOW (ref 0.3–1.2)
Total Protein: 7.6 g/dL (ref 6.0–8.3)

## 2013-05-16 LAB — GLUCOSE, CAPILLARY: Glucose-Capillary: 93 mg/dL (ref 70–99)

## 2013-05-16 LAB — URINALYSIS, ROUTINE W REFLEX MICROSCOPIC
Hgb urine dipstick: NEGATIVE
Nitrite: NEGATIVE
Specific Gravity, Urine: 1.01 (ref 1.005–1.030)
Urobilinogen, UA: 0.2 mg/dL (ref 0.0–1.0)

## 2013-05-16 MED ORDER — CIPROFLOXACIN HCL 500 MG PO TABS: 500.0000 mg | ORAL_TABLET | Freq: Two times a day (BID) | ORAL | Status: DC

## 2013-05-16 MED ORDER — STERILE WATER FOR INJECTION IJ SOLN
INTRAMUSCULAR | Status: AC
  Filled 2013-05-16: qty 10

## 2013-05-16 MED ORDER — AZITHROMYCIN 250 MG PO TABS
1000.0000 mg | ORAL_TABLET | Freq: Once | ORAL | Status: AC
  Administered 2013-05-16: 1000 mg via ORAL
  Filled 2013-05-16: qty 4

## 2013-05-16 MED ORDER — KETOROLAC TROMETHAMINE 30 MG/ML IJ SOLN
30.0000 mg | Freq: Once | INTRAMUSCULAR | Status: AC
  Administered 2013-05-16: 30 mg via INTRAVENOUS
  Filled 2013-05-16: qty 1

## 2013-05-16 MED ORDER — NAPROXEN 500 MG PO TABS: 500.0000 mg | ORAL_TABLET | Freq: Two times a day (BID) | ORAL | Status: DC

## 2013-05-16 MED ORDER — CEFTRIAXONE SODIUM 250 MG IJ SOLR
250.0000 mg | Freq: Once | INTRAMUSCULAR | Status: AC
Start: 2013-05-16 — End: 2013-05-16
  Administered 2013-05-16: 250 mg via INTRAMUSCULAR
  Filled 2013-05-16: qty 250

## 2013-05-16 MED ORDER — SODIUM CHLORIDE 0.9 % IV BOLUS (SEPSIS)
1000.0000 mL | Freq: Once | INTRAVENOUS | Status: AC
  Administered 2013-05-16: 1000 mL via INTRAVENOUS

## 2013-05-16 MED ORDER — TRAMADOL HCL 50 MG PO TABS: 50.0000 mg | ORAL_TABLET | Freq: Four times a day (QID) | ORAL | Status: DC | PRN

## 2013-05-16 MED ORDER — ONDANSETRON HCL 4 MG/2ML IJ SOLN
4.0000 mg | Freq: Once | INTRAMUSCULAR | Status: AC
  Administered 2013-05-16: 4 mg via INTRAVENOUS
  Filled 2013-05-16: qty 2

## 2013-05-16 NOTE — ED Notes (Signed)
Patient states that he is still hurting and thirsty at this time, wanting to know if he can have something at this time. RN Galen Daft made aware of his needs.

## 2013-05-16 NOTE — ED Notes (Signed)
Patient c/o urinary frequency with associated lower abdominal pain since yesterday.

## 2013-05-16 NOTE — ED Provider Notes (Signed)
CSN: [id number]     Arrival date & time 05/16/13  0130 History   First MD Initiated Contact with Patient 05/16/13 0220     Chief Complaint  Patient presents with  . Urinary Frequency   (Consider location/radiation/quality/duration/timing/severity/associated sxs/prior Treatment) HPI Comments: 42 year old male with a history of epididymitis and pancreatitis who presents with a complaint of increased urinary frequency which started on Friday. The patient states that initially he was urinating frequently all day long that he developed increased frequency at night and was awakening 4-5 times per night to urinate. He has bilateral sharp pain in the lower abdomen which radiates to his back. He does complain of nausea with no vomiting and has been diaphoretic intermittently. His urine is reported as normal and he has no diarrhea. He has had a cholecystectomy in the past, has never had a urinary tract infection or a kidney stone. Nothing seems to make the symptoms better or worse  Patient is a 42 y.o. male presenting with frequency. The history is provided by the patient.  Urinary Frequency    Past Medical History  Diagnosis Date  . Epididymitis, bilateral   . Pancreatitis, acute    Past Surgical History  Procedure Laterality Date  . Cholecystectomy    . Knee surgery     No family history on file. History  Substance Use Topics  . Smoking status: Current Every Day Smoker  . Smokeless tobacco: Not on file  . Alcohol Use: No    Review of Systems  Genitourinary: Positive for frequency.  All other systems reviewed and are negative.    Allergies  Zolpidem tartrate  Home Medications   Current Outpatient Rx  Name  Route  Sig  Dispense  Refill  . acetaminophen (TYLENOL) 500 MG tablet   Oral   Take 1,000 mg by mouth every 6 (six) hours as needed for pain.         Marland Kitchen HYDROcodone-acetaminophen (NORCO/VICODIN) 5-325 MG per tablet   Oral   Take 1 tablet by mouth every 6 (six) hours as  needed for moderate pain.         . ciprofloxacin (CIPRO) 500 MG tablet   Oral   Take 1 tablet (500 mg total) by mouth every 12 (twelve) hours.   56 tablet   0   . naproxen (NAPROSYN) 500 MG tablet   Oral   Take 1 tablet (500 mg total) by mouth 2 (two) times daily with a meal.   30 tablet   0   . traMADol (ULTRAM) 50 MG tablet   Oral   Take 1 tablet (50 mg total) by mouth every 6 (six) hours as needed.   15 tablet   0    BP 125/83  Pulse 108  Temp(Src) 98.6 F (37 C) (Oral)  Resp 18  Ht 6' (1.829 m)  Wt 260 lb (117.935 kg)  BMI 35.25 kg/m2  SpO2 97% Physical Exam  Nursing note and vitals reviewed. Constitutional: He appears well-developed and well-nourished.  Uncomfortable appearing, pacing the room  HENT:  Head: Normocephalic and atraumatic.  Mouth/Throat: Oropharynx is clear and moist. No oropharyngeal exudate.  Eyes: Conjunctivae and EOM are normal. Pupils are equal, round, and reactive to light. Right eye exhibits no discharge. Left eye exhibits no discharge. No scleral icterus.  Neck: Normal range of motion. Neck supple. No JVD present. No thyromegaly present.  Cardiovascular: Regular rhythm, normal heart sounds and intact distal pulses.  Exam reveals no gallop and no friction rub.  No murmur heard. Mild tachycardia  Pulmonary/Chest: Effort normal and breath sounds normal. No respiratory distress. He has no wheezes. He has no rales.  Abdominal: Soft. Bowel sounds are normal. He exhibits no distension and no mass. There is tenderness ( Tenderness present to the bilateral lower abdomen, as well as the bilateral upper abdomen, no guarding, no focality).  No surgical finding  Musculoskeletal: Normal range of motion. He exhibits no edema and no tenderness.  Lymphadenopathy:    He has no cervical adenopathy.  Neurological: He is alert. Coordination normal.  Skin: Skin is warm and dry. No rash noted. No erythema.  Psychiatric: He has a normal mood and affect. His  behavior is normal.    ED Course  Procedures (including critical care time) Labs Review Labs Reviewed  CBC WITH DIFFERENTIAL - Abnormal; Notable for the following:    WBC 14.8 (*)    Neutro Abs 9.2 (*)    Lymphs Abs 5.0 (*)    All other components within normal limits  COMPREHENSIVE METABOLIC PANEL - Abnormal; Notable for the following:    Sodium 133 (*)    Glucose, Bld 146 (*)    Total Bilirubin 0.2 (*)    All other components within normal limits  URINALYSIS, ROUTINE W REFLEX MICROSCOPIC  GLUCOSE, CAPILLARY   Imaging Review Ct Abdomen Pelvis Wo Contrast  05/16/2013   CLINICAL DATA:  Increased urinary frequency; abdominal pain and lower back pain. Nausea.  EXAM: CT ABDOMEN AND PELVIS WITHOUT CONTRAST  TECHNIQUE: Multidetector CT imaging of the abdomen and pelvis was performed following the standard protocol without intravenous contrast.  COMPARISON:  CT of the abdomen and pelvis performed 03/19/2012  FINDINGS: The visualized lung bases are clear.  The liver and spleen are unremarkable in appearance. The patient is status post cholecystectomy, with clips noted at the gallbladder fossa. The pancreas and adrenal glands are unremarkable.  The kidneys are unremarkable in appearance. There is no evidence of hydronephrosis. No renal or ureteral stones are seen. No perinephric stranding is appreciated.  No free fluid is identified. The small bowel is unremarkable in appearance. The stomach is within normal limits. No acute vascular abnormalities are seen.  The appendix is normal in caliber, without evidence for appendicitis. The colon is unremarkable in appearance.  The bladder is mildly distended and grossly unremarkable in appearance. The prostate remains normal in size. No inguinal lymphadenopathy is seen.  No acute osseous abnormalities are identified.  IMPRESSION: No acute abnormality seen within the abdomen or pelvis.   Electronically Signed   By: Roanna Raider M.D.   On: 05/16/2013 05:00     EKG Interpretation   None       MDM   1. Prostatitis    The patient appears uncomfortable, he is mildly tachycardic, would consider urinary tract infection, pyelonephritis despite not having any CVA tenderness, urine infection, kidney stone, new-onset diabetes as well in the differential.  Labs overall unremarkable except for mild leukocytosis. CT scan shows no signs of kidney stone, urinalysis is totally clean. The patient now states that he has some risk factors for STD as he has increased numbers of sexual partners recently. I also suspected he may have prostatitis though he refuses prostate exam at this time. Will treat prophylactically for both STD and prostatitis, see meds below, patient referred to urology as an outpatient.   Meds given in ED:  Medications  cefTRIAXone (ROCEPHIN) injection 250 mg (not administered)  azithromycin (ZITHROMAX) tablet 1,000 mg (not administered)  sodium  chloride 0.9 % bolus 1,000 mL (1,000 mLs Intravenous New Bag/Given 05/16/13 0308)  ondansetron (ZOFRAN) injection 4 mg (4 mg Intravenous Given 05/16/13 0308)  ketorolac (TORADOL) 30 MG/ML injection 30 mg (30 mg Intravenous Given 05/16/13 0308)    New Prescriptions   CIPROFLOXACIN (CIPRO) 500 MG TABLET    Take 1 tablet (500 mg total) by mouth every 12 (twelve) hours.   NAPROXEN (NAPROSYN) 500 MG TABLET    Take 1 tablet (500 mg total) by mouth 2 (two) times daily with a meal.   TRAMADOL (ULTRAM) 50 MG TABLET    Take 1 tablet (50 mg total) by mouth every 6 (six) hours as needed.      Vida Roller, MD 05/16/13 [phone]

## 2013-05-16 NOTE — ED Notes (Signed)
Urinary frequency onset 2 days ago, abd pain/low back pain  started yesterday with nausea

## 2014-06-24 ENCOUNTER — Emergency Department (HOSPITAL_COMMUNITY)
Admission: EM | Admit: 2014-06-24 | Discharge: 2014-06-25 | Discharge status: Against Medical Advice | Payer: Self-pay | Attending: Emergency Medicine | Admitting: Emergency Medicine

## 2014-06-24 ENCOUNTER — Encounter (HOSPITAL_COMMUNITY): Payer: Self-pay

## 2014-06-24 ENCOUNTER — Emergency Department (HOSPITAL_COMMUNITY): Payer: Self-pay

## 2014-06-24 DIAGNOSIS — Y9289 Other specified places as the place of occurrence of the external cause: Secondary | ICD-10-CM | POA: Insufficient documentation

## 2014-06-24 DIAGNOSIS — R51 Headache: Secondary | ICD-10-CM

## 2014-06-24 DIAGNOSIS — Z8719 Personal history of other diseases of the digestive system: Secondary | ICD-10-CM | POA: Insufficient documentation

## 2014-06-24 DIAGNOSIS — W19XXXA Unspecified fall, initial encounter: Secondary | ICD-10-CM

## 2014-06-24 DIAGNOSIS — S199XXA Unspecified injury of neck, initial encounter: Secondary | ICD-10-CM | POA: Insufficient documentation

## 2014-06-24 DIAGNOSIS — Y9389 Activity, other specified: Secondary | ICD-10-CM | POA: Insufficient documentation

## 2014-06-24 DIAGNOSIS — R55 Syncope and collapse: Secondary | ICD-10-CM | POA: Insufficient documentation

## 2014-06-24 DIAGNOSIS — S0990XA Unspecified injury of head, initial encounter: Secondary | ICD-10-CM | POA: Insufficient documentation

## 2014-06-24 DIAGNOSIS — W01198A Fall on same level from slipping, tripping and stumbling with subsequent striking against other object, initial encounter: Secondary | ICD-10-CM | POA: Insufficient documentation

## 2014-06-24 DIAGNOSIS — Z720 Tobacco use: Secondary | ICD-10-CM | POA: Insufficient documentation

## 2014-06-24 DIAGNOSIS — Z791 Long term (current) use of non-steroidal anti-inflammatories (NSAID): Secondary | ICD-10-CM | POA: Insufficient documentation

## 2014-06-24 DIAGNOSIS — R519 Headache, unspecified: Secondary | ICD-10-CM

## 2014-06-24 DIAGNOSIS — Z87448 Personal history of other diseases of urinary system: Secondary | ICD-10-CM | POA: Insufficient documentation

## 2014-06-24 DIAGNOSIS — Y998 Other external cause status: Secondary | ICD-10-CM | POA: Insufficient documentation

## 2014-06-24 LAB — BASIC METABOLIC PANEL
Anion gap: 13 (ref 5–15)
BUN: 10 mg/dL (ref 6–23)
CALCIUM: 8.6 mg/dL (ref 8.4–10.5)
CHLORIDE: 103 meq/L (ref 96–112)
CO2: 25 meq/L (ref 19–32)
CREATININE: 1.02 mg/dL (ref 0.50–1.35)
GFR calc Af Amer: >90 mL/min (ref >90)
GFR calc non Af Amer: 89 mL/min — ABNORMAL LOW (ref >90)
Glucose, Bld: 125 mg/dL — ABNORMAL HIGH (ref 70–99)
Potassium: 3.8 mEq/L (ref 3.7–5.3)
Sodium: 141 mEq/L (ref 137–147)

## 2014-06-24 LAB — CBC WITH DIFFERENTIAL/PLATELET
Basophils Absolute: 0 10*3/uL (ref 0.0–0.1)
Basophils Relative: 0 % (ref 0–1)
EOS PCT: 2 % (ref 0–5)
Eosinophils Absolute: 0.2 10*3/uL (ref 0.0–0.7)
HEMATOCRIT: 39.2 % (ref 39.0–52.0)
HEMOGLOBIN: 13.3 g/dL (ref 13.0–17.0)
LYMPHS PCT: 27 % (ref 12–46)
Lymphs Abs: 3.7 10*3/uL (ref 0.7–4.0)
MCH: 32.1 pg (ref 26.0–34.0)
MCHC: 33.9 g/dL (ref 30.0–36.0)
MCV: 94.7 fL (ref 78.0–100.0)
MONO ABS: 1 10*3/uL (ref 0.1–1.0)
MONOS PCT: 7 % (ref 3–12)
NEUTROS ABS: 8.9 10*3/uL — AB (ref 1.7–7.7)
Neutrophils Relative %: 64 % (ref 43–77)
Platelets: 238 10*3/uL (ref 150–400)
RBC: 4.14 MIL/uL — ABNORMAL LOW (ref 4.22–5.81)
RDW: 13.5 % (ref 11.5–15.5)
WBC: 13.8 10*3/uL — AB (ref 4.0–10.5)

## 2014-06-24 LAB — URINALYSIS, ROUTINE W REFLEX MICROSCOPIC
Bilirubin Urine: NEGATIVE
Glucose, UA: NEGATIVE mg/dL
Hgb urine dipstick: NEGATIVE
Ketones, ur: NEGATIVE mg/dL
LEUKOCYTES UA: NEGATIVE
NITRITE: NEGATIVE
PH: 5.5 (ref 5.0–8.0)
Protein, ur: NEGATIVE mg/dL
Urobilinogen, UA: 0.2 mg/dL (ref 0.0–1.0)

## 2014-06-24 LAB — CBG MONITORING, ED: GLUCOSE-CAPILLARY: 98 mg/dL (ref 70–99)

## 2014-06-24 LAB — MAGNESIUM: Magnesium: 2 mg/dL (ref 1.5–2.5)

## 2014-06-24 LAB — TROPONIN I: Troponin I: <0.3 ng/mL (ref <0.30)

## 2014-06-24 MED ORDER — SODIUM CHLORIDE 0.9 % IV BOLUS (SEPSIS)
1000.0000 mL | Freq: Once | INTRAVENOUS | Status: AC
  Administered 2014-06-24: 1000 mL via INTRAVENOUS

## 2014-06-24 MED ORDER — IBUPROFEN 800 MG PO TABS: 800.0000 mg | ORAL_TABLET | Freq: Three times a day (TID) | ORAL | Status: DC | PRN

## 2014-06-24 MED ORDER — FENTANYL CITRATE 0.05 MG/ML IJ SOLN
50.0000 ug | Freq: Once | INTRAMUSCULAR | Status: AC
Start: 2014-06-24 — End: 2014-06-24
  Administered 2014-06-24: 50 ug via INTRAVENOUS
  Filled 2014-06-24: qty 2

## 2014-06-24 MED ORDER — MORPHINE SULFATE 4 MG/ML IJ SOLN
4.0000 mg | Freq: Once | INTRAMUSCULAR | Status: AC
  Administered 2014-06-24: 4 mg via INTRAVENOUS
  Filled 2014-06-24: qty 1

## 2014-06-24 MED ORDER — HYDROCODONE-ACETAMINOPHEN 5-325 MG PO TABS: 1.0000 | ORAL_TABLET | ORAL | Status: DC | PRN

## 2014-06-24 MED ORDER — OXYCODONE-ACETAMINOPHEN 5-325 MG PO TABS
2.0000 | ORAL_TABLET | Freq: Once | ORAL | Status: AC
  Administered 2014-06-24: 2 via ORAL
  Filled 2014-06-24: qty 2

## 2014-06-24 MED ORDER — ONDANSETRON HCL 4 MG/2ML IJ SOLN
4.0000 mg | Freq: Once | INTRAMUSCULAR | Status: AC
  Administered 2014-06-24: 4 mg via INTRAVENOUS
  Filled 2014-06-24: qty 2

## 2014-06-24 MED ORDER — IBUPROFEN 800 MG PO TABS
800.0000 mg | ORAL_TABLET | Freq: Once | ORAL | Status: AC
  Administered 2014-06-24: 800 mg via ORAL
  Filled 2014-06-24: qty 1

## 2014-06-24 NOTE — ED Notes (Signed)
Still slightly dizzy with position change but much better than upon arrival

## 2014-06-24 NOTE — Discharge Instructions (Signed)
Syncope °Syncope is a medical term for fainting or passing out. This means you lose consciousness and drop to the ground. People are generally unconscious for less than 5 minutes. You may have some muscle twitches for up to 15 seconds before waking up and returning to normal. Syncope occurs more often in older adults, but it can happen to anyone. While most causes of syncope are not dangerous, syncope can be a sign of a serious medical problem. It is important to seek medical care.  °CAUSES  °Syncope is caused by a sudden drop in blood flow to the brain. The specific cause is often not determined. Factors that can bring on syncope include: °· Taking medicines that lower blood pressure. °· Sudden changes in posture, such as standing up quickly. °· Taking more medicine than prescribed. °· Standing in one place for too long. °· Seizure disorders. °· Dehydration and excessive exposure to heat. °· Low blood sugar (hypoglycemia). °· Straining to have a bowel movement. °· Heart disease, irregular heartbeat, or other circulatory problems. °· Fear, emotional distress, seeing blood, or severe pain. °SYMPTOMS  °Right before fainting, you may: °· Feel dizzy or light-headed. °· Feel nauseous. °· See all white or all black in your field of vision. °· Have cold, clammy skin. °DIAGNOSIS  °Your health care provider will ask about your symptoms, perform a physical exam, and perform an electrocardiogram (ECG) to record the electrical activity of your heart. Your health care provider may also perform other heart or blood tests to determine the cause of your syncope which may include: °· Transthoracic echocardiogram (TTE). During echocardiography, sound waves are used to evaluate how blood flows through your heart. °· Transesophageal echocardiogram (TEE). °· Cardiac monitoring. This allows your health care provider to monitor your heart rate and rhythm in real time. °· Holter monitor. This is a portable device that records your  heartbeat and can help diagnose heart arrhythmias. It allows your health care provider to track your heart activity for several days, if needed. °· Stress tests by exercise or by giving medicine that makes the heart beat faster. °TREATMENT  °In most cases, no treatment is needed. Depending on the cause of your syncope, your health care provider may recommend changing or stopping some of your medicines. °HOME CARE INSTRUCTIONS °· Have someone stay with you until you feel stable. °· Do not drive, use machinery, or play sports until your health care provider says it is okay. °· Keep all follow-up appointments as directed by your health care provider. °· Lie down right away if you start feeling like you might faint. Breathe deeply and steadily. Wait until all the symptoms have passed. °· Drink enough fluids to keep your urine clear or pale yellow. °· If you are taking blood pressure or heart medicine, get up slowly and take several minutes to sit and then stand. This can reduce dizziness. °SEEK IMMEDIATE MEDICAL CARE IF:  °· You have a severe headache. °· You have unusual pain in the chest, abdomen, or back. °· You are bleeding from your mouth or rectum, or you have black or tarry stool. °· You have an irregular or very fast heartbeat. °· You have pain with breathing. °· You have repeated fainting or seizure-like jerking during an episode. °· You faint when sitting or lying down. °· You have confusion. °· You have trouble walking. °· You have severe weakness. °· You have vision problems. °If you fainted, call your local emergency services (911 in [place] Do not drive   yourself to the hospital.  MAKE SURE YOU:  Understand these instructions.  Will watch your condition.  Will get help right away if you are not doing well or get worse. Document Released: 06/24/2005 Document Revised: 06/29/2013 Document Reviewed: 08/23/2011 [facility] Patient Information 2015 [place], [place]. This information is not intended to replace  advice given to you by your health care provider. Make sure you discuss any questions you have with your health care provider.   Possible Dehydration, Adult Dehydration is when you lose more fluids from the body than you take in. Vital organs like the kidneys, brain, and heart cannot function without a proper amount of fluids and salt. Any loss of fluids from the body can cause dehydration.  CAUSES   Vomiting.  Diarrhea.  Excessive sweating.  Excessive urine output.  Fever. SYMPTOMS  Mild dehydration  Thirst.  Dry lips.  Slightly dry mouth. Moderate dehydration  Very dry mouth.  Sunken eyes.  Skin does not bounce back quickly when lightly pinched and released.  Dark urine and decreased urine production.  Decreased tear production.  Headache. Severe dehydration  Very dry mouth.  Extreme thirst.  Rapid, weak pulse (more than 100 beats per minute at rest).  Cold hands and feet.  Not able to sweat in spite of heat and temperature.  Rapid breathing.  Blue lips.  Confusion and lethargy.  Difficulty being awakened.  Minimal urine production.  No tears. DIAGNOSIS  Your caregiver will diagnose dehydration based on your symptoms and your exam. Blood and urine tests will help confirm the diagnosis. The diagnostic evaluation should also identify the cause of dehydration. TREATMENT  Treatment of mild or moderate dehydration can often be done at home by increasing the amount of fluids that you drink. It is best to drink small amounts of fluid more often. Drinking too much at one time can make vomiting worse. Refer to the home care instructions below. Severe dehydration needs to be treated at the hospital where you will probably be given intravenous (IV) fluids that contain water and electrolytes. HOME CARE INSTRUCTIONS   Ask your caregiver about specific rehydration instructions.  Drink enough fluids to keep your urine clear or pale yellow.  Drink small  amounts frequently if you have nausea and vomiting.  Eat as you normally do.  Avoid:  Foods or drinks high in sugar.  Carbonated drinks.  Juice.  Extremely hot or cold fluids.  Drinks with caffeine.  Fatty, greasy foods.  Alcohol.  Tobacco.  Overeating.  Gelatin desserts.  Wash your hands well to avoid spreading bacteria and viruses.  Only take over-the-counter or prescription medicines for pain, discomfort, or fever as directed by your caregiver.  Ask your caregiver if you should continue all prescribed and over-the-counter medicines.  Keep all follow-up appointments with your caregiver. SEEK MEDICAL CARE IF:  You have abdominal pain and it increases or stays in one area (localizes).  You have a rash, stiff neck, or severe headache.  You are irritable, sleepy, or difficult to awaken.  You are weak, dizzy, or extremely thirsty. SEEK IMMEDIATE MEDICAL CARE IF:   You are unable to keep fluids down or you get worse despite treatment.  You have frequent episodes of vomiting or diarrhea.  You have blood or green matter (bile) in your vomit.  You have blood in your stool or your stool looks black and tarry.  You have not urinated in 6 to 8 hours, or you have only urinated a small amount of very dark  urine.  You have a fever.  You faint. MAKE SURE YOU:   Understand these instructions.  Will watch your condition.  Will get help right away if you are not doing well or get worse. Document Released: 06/24/2005 Document Revised: 09/16/2011 Document Reviewed: 02/11/2011 [facility] Patient Information 2015 [place], [place]. This information is not intended to replace advice given to you by your health care provider. Make sure you discuss any questions you have with your health care provider.

## 2014-06-24 NOTE — ED Notes (Signed)
Attempted to do full orthostatic vs, pt felt very dizzy and grabbed my arm to steady himself when sitting. Standing BP deferred.

## 2014-06-24 NOTE — ED Notes (Signed)
Stood to urinate, feels much better, "ready to go home"

## 2014-06-24 NOTE — ED Notes (Signed)
Was shopping, became confused and lightheaded and nauseous. Larey SeatFell unconscious, hit head on counter. No hx of syncope. Daughter at home with N/V/D

## 2014-06-24 NOTE — ED Provider Notes (Signed)
This chart was scribed for [name] MawKristen N Frady Taddeo, DO by [name] [name], ED Scribe. This patient was seen in room APA05/APA05 and the patient's care was started at 7:55 PM.  TIME SEEN: 7:55 PM  CHIEF COMPLAINT: Loss of Consciousness  HPI:  HPI Comments: [name] [name] is a 43 y.o. male with prior history of pancreatitis who presents to the Emergency Department complaining of an episode of LOC that occurred today. He states that while he was shopping, he became "confused" (states he was looking for ginger ale and continued to walk around the store because he couldn't find it). He states that he was still able to move his extremities, no focal weakness or numbness, he states he still knew who he was and where he was and was able to speak normally. He states that he felt very "weird". He reports that he had LOC while ambulating and hit his head on the counter. States he had been walking around the store for approximate 10 minutes. Denies that he had any preceding chest pain, palpitations, shortness of breath, lightheadedness. He states that he currently has a constant moderate headache and neck pain. States his daughter has a viral gastroenteritis but he denies any recent nausea, vomiting or diarrhea.  He denies any prior history of PE or DVT. No recent prolonged immobilization such as long flight, hospitalization, fracture, surgery, trauma. States he does smoke but has no history of hypertension, diabetes or hyperlipidemia, CAD.   ROS: See HPI Constitutional: no fever  Eyes: no drainage  ENT: no runny nose   Cardiovascular: no chest pain or discomfort, no palpitations Resp: no SOB  GI: no vomiting, no diarrhea, no blood in stool,  GU: no dysuria, Integumentary: no rash  Allergy: no hives  Musculoskeletal: no leg swelling  Neurological: syncope, mild confusion, headache, no slurred speech ROS otherwise negative  PAST MEDICAL HISTORY/PAST SURGICAL HISTORY:  Past Medical History  Diagnosis Date  .  Epididymitis, bilateral   . Pancreatitis, acute     MEDICATIONS:  Prior to Admission medications   Medication Sig Start Date End Date Taking? Authorizing Provider  acetaminophen (TYLENOL) 500 MG tablet Take 1,000 mg by mouth every 6 (six) hours as needed for pain.    Historical Provider, MD  ciprofloxacin (CIPRO) 500 MG tablet Take 1 tablet (500 mg total) by mouth every 12 (twelve) hours. 05/16/13   Vida RollerBrian D Miller, MD  HYDROcodone-acetaminophen (NORCO/VICODIN) 5-325 MG per tablet Take 1 tablet by mouth every 6 (six) hours as needed for moderate pain.    Historical Provider, MD  naproxen (NAPROSYN) 500 MG tablet Take 1 tablet (500 mg total) by mouth 2 (two) times daily with a meal. 05/16/13   Vida RollerBrian D Miller, MD  traMADol (ULTRAM) 50 MG tablet Take 1 tablet (50 mg total) by mouth every 6 (six) hours as needed. 05/16/13   Vida RollerBrian D Miller, MD    ALLERGIES:  Allergies  Allergen Reactions  . Zolpidem Tartrate Itching    SOCIAL HISTORY:  History  Substance Use Topics  . Smoking status: Current Every Day Smoker  . Smokeless tobacco: Not on file  . Alcohol Use: No    FAMILY HISTORY: No family history on file.  EXAM: BP 138/103 mmHg  Pulse 92  Temp(Src) 98.6 F (37 C) (Oral)  Resp 19  Ht 6' (1.829 m)  Wt 260 lb (117.935 kg)  BMI 35.25 kg/m2  SpO2 97% CONSTITUTIONAL: Alert and oriented and responds appropriately to questions. Well-appearing; well-nourished HEAD: Normocephalic; tender to palpation  over the posterior occiput with no signs of skull fracture; left-sided scalp hematoma without sign of skull fracture EYES: Conjunctivae clear, PERRL ENT: normal nose; no rhinorrhea; moist mucous membranes; pharynx without lesions noted; no dental injury or septal hematoma NECK: Supple, no meningismus, no LAD; patient does have some lower cervical spine tenderness without step-off or deformity  CARD: Regular and tachycardic; S1 and S2 appreciated; no murmurs, no clicks, no rubs, no gallops, no  calf tenderness or swelling RESP: Normal chest excursion without splinting or tachypnea; breath sounds clear and equal bilaterally; no wheezes, no rhonchi, no rales, no hypoxia or respiratory distress ABD/GI: Normal bowel sounds; non-distended; soft, non-tender, no rebound, no guarding BACK:  The back appears normal and is non-tender to palpation, there is no CVA tenderness; no midline spinal tenderness or step-off or deformity EXT: Normal ROM in all joints; non-tender to palpation; no edema; normal capillary refill; no cyanosis    SKIN: Normal color for age and race; warm NEURO: Moves all extremities equally, sensation to light touch intact diffusely, cranial nerves II through XII intact PSYCH: The patient's mood and manner are appropriate. Grooming and personal hygiene are appropriate.    MEDICAL DECISION MAKING: Patient here with an episode of syncope. He has no risk factors for ACS other than tobacco use, obesity. He is very orthostatic in the ED with his heart rate jumping in the 120s with sitting upright and is unable to stand without becoming extremely symptomatic. We'll give IV fluids. Will check cardiac labs, CBG. EKG shows no ischemic changes. Doubt pulmonary embolus, DVT as he has no risk factors and denies chest pain and shortness of breath. We'll obtain a CT of his head and cervical spine given his complaint headache and significant neck pain on exam. Will give fentanyl for pain.  ED PROGRESS: 9:17 PM  Labs show leukocytosis with left shift which appears to be chronic for patient. Electrolytes are normal including normal potassium and magnesium. Troponin negative. CBG 98. Head and cervical spine CT show no acute intracranial abnormality or cervical spine fracture or subluxation. We'll continue to hydrate with IV fluids, obtain urinalysis.  10:13 PM  Pt does not want to wait for his urine to result. Have recommended he stay for a second troponin but he refuses. States he needs to get home  to take care of his 42 year old daughter. Have discussed with him that he could've had a cardiac event that led to this syncopal episode that we have not yet diagnosed. He states he understands that there are risks but he wants to leave. Have discussed with patient that he will need to leave AGAINST MEDICAL ADVICE. We'll give him discharge. We'll discharge with paperwork with return precautions and pain medication as he is still complaining of headache. Patient is competent to make this decision. Able to verbalize risks back to me.   EKG Interpretation  Date/Time:  Friday June 24 2014 19:43:41 EST Ventricular Rate:  89 PR Interval:  190 QRS Duration: 95 QT Interval:  360 QTC Calculation: 438 R Axis:   80 Text Interpretation:  Age not entered, assumed to be  43 years old for purpose of ECG interpretation Sinus rhythm Baseline wander in lead(s) I III aVL No significant change since last tracing Confirmed by Tehila Sokolow,  DO, [place] [phone]([id number] on 06/24/2014 8:32:23 PM        I personally performed the services described in this documentation, which was scribed in my presence. The recorded information has been reviewed and is accurate.  [name] Maw Zahira Brummond, DO 06/24/14 2215

## 2014-12-21 ENCOUNTER — Emergency Department (HOSPITAL_COMMUNITY)
Admission: EM | Admit: 2014-12-21 | Discharge: 2014-12-21 | Disposition: A | Discharge status: Home / Self Care | Payer: Self-pay | Attending: Emergency Medicine | Admitting: Emergency Medicine

## 2014-12-21 ENCOUNTER — Encounter (HOSPITAL_COMMUNITY): Payer: Self-pay | Admitting: Emergency Medicine

## 2014-12-21 DIAGNOSIS — R252 Cramp and spasm: Secondary | ICD-10-CM | POA: Insufficient documentation

## 2014-12-21 DIAGNOSIS — Z9049 Acquired absence of other specified parts of digestive tract: Secondary | ICD-10-CM | POA: Insufficient documentation

## 2014-12-21 DIAGNOSIS — Z8719 Personal history of other diseases of the digestive system: Secondary | ICD-10-CM | POA: Insufficient documentation

## 2014-12-21 DIAGNOSIS — Z87891 Personal history of nicotine dependence: Secondary | ICD-10-CM | POA: Insufficient documentation

## 2014-12-21 DIAGNOSIS — R519 Headache, unspecified: Secondary | ICD-10-CM

## 2014-12-21 DIAGNOSIS — R197 Diarrhea, unspecified: Secondary | ICD-10-CM | POA: Insufficient documentation

## 2014-12-21 DIAGNOSIS — H532 Diplopia: Secondary | ICD-10-CM | POA: Insufficient documentation

## 2014-12-21 DIAGNOSIS — E860 Dehydration: Secondary | ICD-10-CM | POA: Insufficient documentation

## 2014-12-21 DIAGNOSIS — R51 Headache: Secondary | ICD-10-CM | POA: Insufficient documentation

## 2014-12-21 DIAGNOSIS — R112 Nausea with vomiting, unspecified: Secondary | ICD-10-CM | POA: Insufficient documentation

## 2014-12-21 DIAGNOSIS — Z87438 Personal history of other diseases of male genital organs: Secondary | ICD-10-CM | POA: Insufficient documentation

## 2014-12-21 DIAGNOSIS — R42 Dizziness and giddiness: Secondary | ICD-10-CM | POA: Insufficient documentation

## 2014-12-21 LAB — COMPREHENSIVE METABOLIC PANEL
ALBUMIN: 4.9 g/dL (ref 3.5–5.0)
ALK PHOS: 90 U/L (ref 38–126)
ALT: 31 U/L (ref 17–63)
ANION GAP: 13 (ref 5–15)
AST: 26 U/L (ref 15–41)
BUN: 15 mg/dL (ref 6–20)
CO2: 20 mmol/L — ABNORMAL LOW (ref 22–32)
Calcium: 9.5 mg/dL (ref 8.9–10.3)
Chloride: 102 mmol/L (ref 101–111)
Creatinine, Ser: 1.39 mg/dL — ABNORMAL HIGH (ref 0.61–1.24)
GFR calc Af Amer: >60 mL/min (ref >60)
GFR calc non Af Amer: >60 mL/min (ref >60)
GLUCOSE: 146 mg/dL — AB (ref 65–99)
POTASSIUM: 3.7 mmol/L (ref 3.5–5.1)
Sodium: 135 mmol/L (ref 135–145)
TOTAL PROTEIN: 9.1 g/dL — AB (ref 6.5–8.1)
Total Bilirubin: 0.8 mg/dL (ref 0.3–1.2)

## 2014-12-21 LAB — CBC WITH DIFFERENTIAL/PLATELET
Basophils Absolute: 0 10*3/uL (ref 0.0–0.1)
Basophils Relative: 0 % (ref 0–1)
EOS ABS: 0.1 10*3/uL (ref 0.0–0.7)
EOS PCT: 0 % (ref 0–5)
HEMATOCRIT: 51.1 % (ref 39.0–52.0)
HEMOGLOBIN: 17.9 g/dL — AB (ref 13.0–17.0)
LYMPHS ABS: 4.3 10*3/uL — AB (ref 0.7–4.0)
LYMPHS PCT: 19 % (ref 12–46)
MCH: 32.7 pg (ref 26.0–34.0)
MCHC: 35 g/dL (ref 30.0–36.0)
MCV: 93.2 fL (ref 78.0–100.0)
MONO ABS: 1.7 10*3/uL — AB (ref 0.1–1.0)
MONOS PCT: 8 % (ref 3–12)
Neutro Abs: 16.3 10*3/uL — ABNORMAL HIGH (ref 1.7–7.7)
Neutrophils Relative %: 73 % (ref 43–77)
Platelets: 415 10*3/uL — ABNORMAL HIGH (ref 150–400)
RBC: 5.48 MIL/uL (ref 4.22–5.81)
RDW: 13.8 % (ref 11.5–15.5)
WBC: 22.4 10*3/uL — AB (ref 4.0–10.5)

## 2014-12-21 LAB — CBG MONITORING, ED: Glucose-Capillary: 157 mg/dL — ABNORMAL HIGH (ref 65–99)

## 2014-12-21 LAB — TROPONIN I: Troponin I: <0.03 ng/mL (ref <0.031)

## 2014-12-21 MED ORDER — ONDANSETRON HCL 4 MG/2ML IJ SOLN
4.0000 mg | Freq: Once | INTRAMUSCULAR | Status: AC
  Administered 2014-12-21: 4 mg via INTRAVENOUS
  Filled 2014-12-21: qty 2

## 2014-12-21 MED ORDER — SODIUM CHLORIDE 0.9 % IV SOLN
1000.0000 mL | Freq: Once | INTRAVENOUS | Status: AC
  Administered 2014-12-21: 1000 mL via INTRAVENOUS

## 2014-12-21 MED ORDER — DIPHENHYDRAMINE HCL 50 MG/ML IJ SOLN
25.0000 mg | Freq: Once | INTRAMUSCULAR | Status: AC
  Administered 2014-12-21: 25 mg via INTRAVENOUS
  Filled 2014-12-21: qty 1

## 2014-12-21 MED ORDER — LOPERAMIDE HCL 2 MG PO CAPS
4.0000 mg | ORAL_CAPSULE | Freq: Once | ORAL | Status: AC
  Administered 2014-12-21: 4 mg via ORAL
  Filled 2014-12-21: qty 2

## 2014-12-21 MED ORDER — ONDANSETRON HCL 4 MG PO TABS: 4.0000 mg | ORAL_TABLET | Freq: Three times a day (TID) | ORAL | Status: DC | PRN

## 2014-12-21 MED ORDER — METOCLOPRAMIDE HCL 5 MG/ML IJ SOLN
10.0000 mg | Freq: Once | INTRAMUSCULAR | Status: AC
  Administered 2014-12-21: 10 mg via INTRAVENOUS
  Filled 2014-12-21: qty 2

## 2014-12-21 MED ORDER — SODIUM CHLORIDE 0.9 % IV BOLUS (SEPSIS)
1000.0000 mL | Freq: Once | INTRAVENOUS | Status: AC
  Administered 2014-12-21: 1000 mL via INTRAVENOUS

## 2014-12-21 MED ORDER — MAGNESIUM SULFATE 2 GM/50ML IV SOLN
2.0000 g | Freq: Once | INTRAVENOUS | Status: AC
  Administered 2014-12-21: 2 g via INTRAVENOUS
  Filled 2014-12-21 (×2): qty 50

## 2014-12-21 MED ORDER — SODIUM CHLORIDE 0.9 % IV SOLN: 1000.0000 mL | INTRAVENOUS | Status: DC

## 2014-12-21 NOTE — ED Notes (Signed)
Pt had small loose stool, pt cleaned, update given,

## 2014-12-21 NOTE — ED Provider Notes (Signed)
CSN: [id number]     Arrival date & time 12/21/14  0246 History   First MD Initiated Contact with Patient 12/21/14 0250     Chief Complaint  Patient presents with  . Emesis     (Consider location/radiation/quality/duration/timing/severity/associated sxs/prior Treatment) HPI   Patient states about 3 PM on he was at work and  he started feeling dizzy meaning he felt like he was going to pass out and he felt weak. He did not have nausea, vomiting or diarrhea at that time. He states he was able to finish work and he got home and went straight to bed which was about 5 PM. At 5:30 he started having watery diarrhea. He estimates he's had about 15 episodes. He has had nausea and vomiting about 5 times and now is just having dry heaves. He denies any fevers but states he did feel clammy. He states he only has abdominal pain when he needs to have the diarrhea and then he describes as a cramping pain that goes away when he has the diarrhea. He states when he stretches his arms or legs he gets cramps which is unusual. He also complains a headache and also complains of some double vision. He states it's worse when he stands up. He denies chest pain, shortness of breath but he is aware his heart is racing. He denies any spinning sensation. He states he started having a headache about 7 PM and he started getting double vision about 3 hours ago. He denies being around anybody else who is ill, he denies eating anything he thinks made him ill. He also denies being on anti-biotics.    PCP Dr Hiram Comber  Past Medical History  Diagnosis Date  . Epididymitis, bilateral   . Pancreatitis, acute    Past Surgical History  Procedure Laterality Date  . Cholecystectomy    . Knee surgery     History reviewed. No pertinent family history. History  Substance Use Topics  . Smoking status: Former Games developer  . Smokeless tobacco: Not on file  . Alcohol Use: No  employed  Review of Systems  All other systems reviewed and  are negative.     Allergies  Zolpidem tartrate  Home Medications   Prior to Admission medications   Medication Sig Start Date End Date Taking? Authorizing Provider  acetaminophen (TYLENOL) 500 MG tablet Take 1,000 mg by mouth every 6 (six) hours as needed for pain.    Historical Provider, MD  ciprofloxacin (CIPRO) 500 MG tablet Take 1 tablet (500 mg total) by mouth every 12 (twelve) hours. Patient not taking: Reported on 06/24/2014 05/16/13   Eber Hong, MD  HYDROcodone-acetaminophen (NORCO/VICODIN) 5-325 MG per tablet Take 1 tablet by mouth every 4 (four) hours as needed. 06/24/14   Kristen N Ward, DO  ibuprofen (ADVIL,MOTRIN) 800 MG tablet Take 1 tablet (800 mg total) by mouth every 8 (eight) hours as needed for mild pain. 06/24/14   Kristen N Ward, DO  naproxen (NAPROSYN) 500 MG tablet Take 1 tablet (500 mg total) by mouth 2 (two) times daily with a meal. Patient not taking: Reported on 06/24/2014 05/16/13   Eber Hong, MD  ondansetron (ZOFRAN) 4 MG tablet Take 1 tablet (4 mg total) by mouth every 8 (eight) hours as needed for nausea or vomiting. 12/21/14   Devoria Albe, MD  traMADol (ULTRAM) 50 MG tablet Take 1 tablet (50 mg total) by mouth every 6 (six) hours as needed. Patient not taking: Reported on 06/24/2014 05/16/13   Arlys John  Hyacinth Meeker, MD   ED Triage Vitals  Enc Vitals Group     BP 12/21/14 0256 132/103 mmHg     Pulse Rate 12/21/14 0256 145     Resp 12/21/14 0256 18     Temp 12/21/14 0256 98.1 F (36.7 C)     Temp src --      SpO2 12/21/14 0256 96 %     Weight 12/21/14 0256 260 lb (117.935 kg)     Height 12/21/14 0256 6' (1.829 m)     Head Cir --      Peak Flow --      Pain Score 12/21/14 0254 8     Pain Loc --      Pain Edu? --      Excl. in GC? --    Vital signs normal except for tachycardia    Physical Exam  Constitutional: He is oriented to person, place, and time. He appears well-developed and well-nourished.  Non-toxic appearance. He does not appear ill. No  distress.  HENT:  Head: Normocephalic and atraumatic.  Right Ear: External ear normal.  Left Ear: External ear normal.  Nose: Nose normal. No mucosal edema or rhinorrhea.  Mouth/Throat: Mucous membranes are normal. No dental abscesses or uvula swelling.  Dry tongue  Eyes: Conjunctivae and EOM are normal. Pupils are equal, round, and reactive to light.  Neck: Normal range of motion and full passive range of motion without pain. Neck supple.  Cardiovascular: Normal rate, regular rhythm and normal heart sounds.  Exam reveals no gallop and no friction rub.   No murmur heard. Pulmonary/Chest: Effort normal and breath sounds normal. No respiratory distress. He has no wheezes. He has no rhonchi. He has no rales. He exhibits no tenderness and no crepitus.  Abdominal: Soft. Normal appearance and bowel sounds are normal. He exhibits no distension. There is no tenderness. There is no rebound and no guarding.  Musculoskeletal: Normal range of motion. He exhibits no edema or tenderness.  Moves all extremities well.   Neurological: He is alert and oriented to person, place, and time. He has normal strength. No cranial nerve deficit.  Skin: Skin is warm, dry and intact. No rash noted. No erythema. No pallor.  Psychiatric: He has a normal mood and affect. His speech is normal and behavior is normal. His mood appears not anxious.  Nursing note and vitals reviewed.   ED Course  Procedures (including critical care time)  Medications  0.9 %  sodium chloride infusion (0 mLs Intravenous Stopped 12/21/14 0455)    Followed by  0.9 %  sodium chloride infusion (1,000 mLs Intravenous New Bag/Given 12/21/14 0405)    Followed by  0.9 %  sodium chloride infusion (not administered)  sodium chloride 0.9 % bolus 1,000 mL (0 mLs Intravenous Stopped 12/21/14 0400)  ondansetron (ZOFRAN) injection 4 mg (4 mg Intravenous Given 12/21/14 0311)  metoCLOPramide (REGLAN) injection 10 mg (10 mg Intravenous Given 12/21/14 0315)   diphenhydrAMINE (BENADRYL) injection 25 mg (25 mg Intravenous Given 12/21/14 0313)  loperamide (IMODIUM) capsule 4 mg (4 mg Oral Given 12/21/14 0330)  magnesium sulfate IVPB 2 g 50 mL (0 g Intravenous Stopped 12/21/14 0454)    04:00 pt given his test results. His double vision is almost gone, no obvious EOMI abnormality.   He was given IV fluids and IV medicine for his nausea. He also was given medication for his headache. At time of discharge he was able to drink ginger ale without vomiting. He had 2 small  episodes of diarrhea while in the ED. He felt much improved and felt ready to be discharged. His tachycardia improved with IV fluids. He actually asked to be discharged.   Labs Review Results for orders placed or performed during the hospital encounter of 12/21/14  Comprehensive metabolic panel  Result Value Ref Range   Sodium 135 135 - 145 mmol/L   Potassium 3.7 3.5 - 5.1 mmol/L   Chloride 102 101 - 111 mmol/L   CO2 20 (L) 22 - 32 mmol/L   Glucose, Bld 146 (H) 65 - 99 mg/dL   BUN 15 6 - 20 mg/dL   Creatinine, Ser 1.61 (H) 0.61 - 1.24 mg/dL   Calcium 9.5 8.9 - 09.6 mg/dL   Total Protein 9.1 (H) 6.5 - 8.1 g/dL   Albumin 4.9 3.5 - 5.0 g/dL   AST 26 15 - 41 U/L   ALT 31 17 - 63 U/L   Alkaline Phosphatase 90 38 - 126 U/L   Total Bilirubin 0.8 0.3 - 1.2 mg/dL   GFR calc non Af Amer >60 >60 mL/min   GFR calc Af Amer >60 >60 mL/min   Anion gap 13 5 - 15  CBC with Differential  Result Value Ref Range   WBC 22.4 (H) 4.0 - 10.5 K/uL   RBC 5.48 4.22 - 5.81 MIL/uL   Hemoglobin 17.9 (H) 13.0 - 17.0 g/dL   HCT 04.5 40.9 - 81.1 %   MCV 93.2 78.0 - 100.0 fL   MCH 32.7 26.0 - 34.0 pg   MCHC 35.0 30.0 - 36.0 g/dL   RDW 91.4 78.2 - 95.6 %   Platelets 415 (H) 150 - 400 K/uL   Neutrophils Relative % 73 43 - 77 %   Neutro Abs 16.3 (H) 1.7 - 7.7 K/uL   Lymphocytes Relative 19 12 - 46 %   Lymphs Abs 4.3 (H) 0.7 - 4.0 K/uL   Monocytes Relative 8 3 - 12 %   Monocytes Absolute 1.7 (H) 0.1 - 1.0  K/uL   Eosinophils Relative 0 0 - 5 %   Eosinophils Absolute 0.1 0.0 - 0.7 K/uL   Basophils Relative 0 0 - 1 %   Basophils Absolute 0.0 0.0 - 0.1 K/uL   WBC Morphology ATYPICAL LYMPHOCYTES   Troponin I  Result Value Ref Range   Troponin I <0.03 <0.031 ng/mL  CBG monitoring, ED  Result Value Ref Range   Glucose-Capillary 157 (H) 65 - 99 mg/dL   Laboratory interpretation all normal except leukocytosis, new elevation of hemoglobin consistent with dehydration, new mild renal insufficiency consistent with dehydration     Imaging Review No results found.   EKG Interpretation   Date/Time:  Wednesday December 21 2014 03:05:12 EDT Ventricular Rate:  136 PR Interval:  110 QRS Duration: 84 QT Interval:  281 QTC Calculation: 423 R Axis:   118 Text Interpretation:  Sinus tachycardia Atrial premature complex Probable  left atrial enlargement Since last tracing rate faster (24 Jun 2014) Right  axis deviation Confirmed by Devanshi Califf  MD-I, Dynesha Woolen ([id number]) on 12/21/2014 3:21:41  AM      MDM   Final diagnoses:  Nausea vomiting and diarrhea  Dehydration  Headache, unspecified headache type    New Prescriptions   ONDANSETRON (ZOFRAN) 4 MG TABLET    Take 1 tablet (4 mg total) by mouth every 8 (eight) hours as needed for nausea or vomiting.  OTC imodium  Plan discharge  Devoria Albe, MD, Concha Pyo, MD 12/21/14  0552 

## 2014-12-21 NOTE — ED Notes (Addendum)
Pt c/o multiple episodes of n/v/d that started this evening, pt also c/o headache that is sensitive to light,  weakness, pt sinus tach on monitor with rate of 145

## 2014-12-21 NOTE — ED Notes (Signed)
Pt states that he is feeling better, had one small loose stool.

## 2014-12-21 NOTE — ED Notes (Signed)
Dr Knapp at bedside,  

## 2014-12-21 NOTE — ED Notes (Signed)
Pt c/o vomiting and diarrhea. Pt also c/o double vision and dizziness since 1700.

## 2014-12-21 NOTE — Discharge Instructions (Signed)
Drink plenty of fluids (clear liquids) the next 12-24 hours then start a bland diet (toast, crackers, jello).  Use the zofran for nausea or vomiting. Take imodium OTC for diarrhea. Avoid mild products until the diarrhea is gone. Recheck if you get worse.   Diarrhea Diarrhea is watery poop (stool). It can make you feel weak, tired, thirsty, or give you a dry mouth (signs of dehydration). Watery poop is a sign of another problem, most often an infection. It often lasts 2-3 days. It can last longer if it is a sign of something serious. Take care of yourself as told by your doctor. HOME CARE   Drink 1 cup (8 ounces) of fluid each time you have watery poop.  Do not drink the following fluids:  Those that contain simple sugars (fructose, glucose, galactose, lactose, sucrose, maltose).  Sports drinks.  Fruit juices.  Whole milk products.  Sodas.  Drinks with caffeine (coffee, tea, soda) or alcohol.  Oral rehydration solution may be used if the doctor says it is okay. You may make your own solution. Follow this recipe:   - teaspoon table salt.   teaspoon baking soda.   teaspoon salt substitute containing potassium chloride.  1 tablespoons sugar.  1 liter (34 ounces) of water.  Avoid the following foods:  High fiber foods, such as raw fruits and vegetables.  Nuts, seeds, and whole grain breads and cereals.   Those that are sweetened with sugar alcohols (xylitol, sorbitol, mannitol).  Try eating the following foods:  Starchy foods, such as rice, toast, pasta, low-sugar cereal, oatmeal, baked potatoes, crackers, and bagels.  Bananas.  Applesauce.  Eat probiotic-rich foods, such as yogurt and milk products that are fermented.  Wash your hands well after each time you have watery poop.  Only take medicine as told by your doctor.  Take a warm bath to help lessen burning or pain from having watery poop. GET HELP RIGHT AWAY IF:   You cannot drink fluids without throwing  up (vomiting).  You keep throwing up.  You have blood in your poop, or your poop looks black and tarry.  You do not pee (urinate) in 6-8 hours, or there is only a small amount of very dark pee.  You have belly (abdominal) pain that gets worse or stays in the same spot (localizes).  You are weak, dizzy, confused, or light-headed.  You have a very bad headache.  Your watery poop gets worse or does not get better.  You have a fever or lasting symptoms for more than 2-3 days.  You have a fever and your symptoms suddenly get worse. MAKE SURE YOU:   Understand these instructions.  Will watch your condition.  Will get help right away if you are not doing well or get worse. Document Released: 12/11/2007 Document Revised: 11/08/2013 Document Reviewed: 03/01/2012 [facility] Patient Information 2015 [place], [place]. This information is not intended to replace advice given to you by your health care provider. Make sure you discuss any questions you have with your health care provider.  Nausea and Vomiting Nausea means you feel sick to your stomach. Throwing up (vomiting) is a reflex where stomach contents come out of your mouth. HOME CARE   Take medicine as told by your doctor.  Do not force yourself to eat. However, you do need to drink fluids.  If you feel like eating, eat a normal diet as told by your doctor.  Eat rice, wheat, potatoes, bread, lean meats, yogurt, fruits, and vegetables.  Avoid high-fat foods.  Drink enough fluids to keep your pee (urine) clear or pale yellow.  Ask your doctor how to replace body fluid losses (rehydrate). Signs of body fluid loss (dehydration) include:  Feeling very thirsty.  Dry lips and mouth.  Feeling dizzy.  Dark pee.  Peeing less than normal.  Feeling confused.  Fast breathing or heart rate. GET HELP RIGHT AWAY IF:   You have blood in your throw up.  You have black or bloody poop (stool).  You have a bad headache or stiff  neck.  You feel confused.  You have bad belly (abdominal) pain.  You have chest pain or trouble breathing.  You do not pee at least once every 8 hours.  You have cold, clammy skin.  You keep throwing up after 24 to 48 hours.  You have a fever. MAKE SURE YOU:   Understand these instructions.  Will watch your condition.  Will get help right away if you are not doing well or get worse. Document Released: 12/11/2007 Document Revised: 09/16/2011 Document Reviewed: 11/23/2010 [facility] Patient Information 2015 [place], [place]. This information is not intended to replace advice given to you by your health care provider. Make sure you discuss any questions you have with your health care provider.

## 2015-03-21 ENCOUNTER — Emergency Department (HOSPITAL_COMMUNITY)
Admission: EM | Admit: 2015-03-21 | Discharge: 2015-03-21 | Disposition: A | Discharge status: Home / Self Care | Payer: Self-pay | Attending: Emergency Medicine | Admitting: Emergency Medicine

## 2015-03-21 ENCOUNTER — Encounter (HOSPITAL_COMMUNITY): Payer: Self-pay | Admitting: Emergency Medicine

## 2015-03-21 DIAGNOSIS — Z8719 Personal history of other diseases of the digestive system: Secondary | ICD-10-CM | POA: Insufficient documentation

## 2015-03-21 DIAGNOSIS — Z87438 Personal history of other diseases of male genital organs: Secondary | ICD-10-CM | POA: Insufficient documentation

## 2015-03-21 DIAGNOSIS — Z87891 Personal history of nicotine dependence: Secondary | ICD-10-CM | POA: Insufficient documentation

## 2015-03-21 DIAGNOSIS — R110 Nausea: Secondary | ICD-10-CM | POA: Insufficient documentation

## 2015-03-21 DIAGNOSIS — L509 Urticaria, unspecified: Secondary | ICD-10-CM | POA: Insufficient documentation

## 2015-03-21 MED ORDER — DIPHENHYDRAMINE HCL 50 MG/ML IJ SOLN
25.0000 mg | Freq: Once | INTRAMUSCULAR | Status: AC
  Administered 2015-03-21: 25 mg via INTRAVENOUS
  Filled 2015-03-21: qty 1

## 2015-03-21 MED ORDER — FENTANYL CITRATE (PF) 100 MCG/2ML IJ SOLN: 75.0000 ug | Freq: Once | INTRAMUSCULAR | Status: DC

## 2015-03-21 MED ORDER — DIPHENHYDRAMINE HCL 25 MG PO TABS: 25.0000 mg | ORAL_TABLET | Freq: Four times a day (QID) | ORAL | Status: DC

## 2015-03-21 MED ORDER — FAMOTIDINE IN NACL 20-0.9 MG/50ML-% IV SOLN
20.0000 mg | Freq: Once | INTRAVENOUS | Status: AC
  Administered 2015-03-21: 20 mg via INTRAVENOUS
  Filled 2015-03-21: qty 50

## 2015-03-21 MED ORDER — FENTANYL CITRATE (PF) 100 MCG/2ML IJ SOLN
INTRAMUSCULAR | Status: AC
  Filled 2015-03-21: qty 2

## 2015-03-21 MED ORDER — PREDNISONE 20 MG PO TABS: 20.0000 mg | ORAL_TABLET | Freq: Two times a day (BID) | ORAL | Status: DC

## 2015-03-21 MED ORDER — SODIUM CHLORIDE 0.9 % IV BOLUS (SEPSIS)
1000.0000 mL | Freq: Once | INTRAVENOUS | Status: AC
  Administered 2015-03-21: 1000 mL via INTRAVENOUS

## 2015-03-21 MED ORDER — ONDANSETRON HCL 4 MG/2ML IJ SOLN
4.0000 mg | Freq: Once | INTRAMUSCULAR | Status: AC
  Administered 2015-03-21: 4 mg via INTRAVENOUS
  Filled 2015-03-21: qty 2

## 2015-03-21 MED ORDER — METHYLPREDNISOLONE SODIUM SUCC 125 MG IJ SOLR
125.0000 mg | Freq: Once | INTRAMUSCULAR | Status: AC
  Administered 2015-03-21: 125 mg via INTRAVENOUS
  Filled 2015-03-21: qty 2

## 2015-03-21 MED ORDER — FENTANYL CITRATE (PF) 100 MCG/2ML IJ SOLN
75.0000 ug | Freq: Once | INTRAMUSCULAR | Status: AC
  Administered 2015-03-21: 75 ug via INTRAVENOUS
  Filled 2015-03-21: qty 2

## 2015-03-21 MED ORDER — EPINEPHRINE HCL 1 MG/ML IJ SOLN
0.2000 mg | Freq: Once | INTRAMUSCULAR | Status: AC
  Administered 2015-03-21: 0.2 mg via SUBCUTANEOUS
  Filled 2015-03-21: qty 1

## 2015-03-21 NOTE — ED Provider Notes (Signed)
CSN: [id number]     Arrival date & time 03/21/15  1606 History   First MD Initiated Contact with Patient 03/21/15 1640     Chief Complaint  Patient presents with  . Urticaria      HPI  Patient presents for evaluation for itching and rash. He was in his normal state of health this morning. He went to work midmorning. He was outside. 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 He started feeling itching and burning in his hands and very quickly over most of his body. He had not eaten anything. He does not recall any bites or stings. Does not take any medications. No history of seasonal allergies. No history of urticarial or allergic reactions. No difficulty breathing or swallowing no sensation of mild swelling mild nausea.  Past Medical History  Diagnosis Date  . Epididymitis, bilateral   . Pancreatitis, acute    Past Surgical History  Procedure Laterality Date  . Cholecystectomy    . Knee surgery     History reviewed. No pertinent family history. Social History  Substance Use Topics  . Smoking status: Former Games developer  . Smokeless tobacco: None  . Alcohol Use: No    Review of Systems  Constitutional: Negative for fever, chills, diaphoresis, appetite change and fatigue.  HENT: Negative for mouth sores, sore throat and trouble swallowing.   Eyes: Negative for visual disturbance.  Respiratory: Negative for cough, chest tightness, shortness of breath and wheezing.   Cardiovascular: Negative for chest pain.  Gastrointestinal: Positive for nausea. Negative for vomiting, abdominal pain, diarrhea and abdominal distention.  Endocrine: Negative for polydipsia, polyphagia and polyuria.  Genitourinary: Negative for dysuria, frequency and hematuria.  Musculoskeletal: Negative for gait problem.  Skin: Positive for rash. Negative for color change and pallor.  Neurological: Negative for dizziness, syncope, light-headedness and headaches.  Hematological: Does not bruise/bleed easily.   Psychiatric/Behavioral: Negative for behavioral problems and confusion.      Allergies  Zolpidem tartrate  Home Medications   Prior to Admission medications   Medication Sig Start Date End Date Taking? Authorizing Provider  acetaminophen (TYLENOL) 500 MG tablet Take 1,000 mg by mouth every 6 (six) hours as needed for pain.   Yes Historical Provider, MD  diphenhydrAMINE (BENADRYL) 25 MG tablet Take 1 tablet (25 mg total) by mouth every 6 (six) hours. 03/21/15   Rolland Porter, MD  predniSONE (DELTASONE) 20 MG tablet Take 1 tablet (20 mg total) by mouth 2 (two) times daily with a meal. 03/21/15   Rolland Porter, MD   BP 125/69 mmHg  Pulse 101  Temp(Src) 98.6 F (37 C) (Oral)  Resp 18  Ht 6' (1.829 m)  Wt 260 lb (117.935 kg)  BMI 35.25 kg/m2  SpO2 96% Physical Exam  Constitutional: He is oriented to person, place, and time. He appears well-developed and well-nourished. No distress.  HENT:  Head: Normocephalic.  Eyes: Conjunctivae are normal. Pupils are equal, round, and reactive to light. No scleral icterus.  Neck: Normal range of motion. Neck supple. No thyromegaly present.  No stridor. Clear lungs. Normal pharynx.  Cardiovascular: Normal rate and regular rhythm.  Exam reveals no gallop and no friction rub.   No murmur heard. Pulmonary/Chest: Effort normal and breath sounds normal. No respiratory distress. He has no wheezes. He has no rales.  Abdominal: Soft. Bowel sounds are normal. He exhibits no distension. There is no tenderness. There is no rebound.  Musculoskeletal: Normal range of motion.  Neurological: He is alert and oriented to  person, place, and time.  Skin: Skin is warm and dry. No rash noted.  Diffuse urticaria. Erythema of the palms.  Psychiatric: He has a normal mood and affect. His behavior is normal.    ED Course  Procedures (including critical care time) Labs Review Labs Reviewed - No data to display  Imaging Review No results found. I have personally  reviewed and evaluated these images and lab results as part of my medical decision-making.   EKG Interpretation None      MDM   Final diagnoses:  Urticaria    IV placed. Given IV Benadryl. Given IV site Medrol. Had improvement but minimal. Complaint is significant swelling and pain in his hand. Reexam shows some swollen. Well-perfused. Given one dose of IV fentanyl and his symptoms improved. Given 0.2 subcutaneous epi and his rash nearly resolved. Plan is home, Benadryl, prednisone, primary care follow-up regarding outpatient allergy testing    Rolland Porter, MD 03/21/15 1906

## 2015-03-21 NOTE — ED Notes (Signed)
Started at work today with itching to entire body.  Pt says he has not eat anything, only drank water today.  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

## 2015-03-21 NOTE — Discharge Instructions (Signed)
Contact your primary care physician regarding arranging allergy testing.  Hives Hives are itchy, red, puffy (swollen) areas of the skin. Hives can change in size and location on your body. Hives can come and go for hours, days, or weeks. Hives do not spread from person to person (noncontagious). Scratching, exercise, and stress can make your hives worse. HOME CARE  Avoid things that cause your hives (triggers).  Take antihistamine medicines as told by your doctor. Do not drive while taking an antihistamine.  Take any other medicines for itching as told by your doctor.  Wear loose-fitting clothing.  Keep all doctor visits as told. GET HELP RIGHT AWAY IF:   You have a fever.  Your tongue or lips are puffy.  You have trouble breathing or swallowing.  You feel tightness in the throat or chest.  You have belly (abdominal) pain.  You have lasting or severe itching that is not helped by medicine.  You have painful or puffy joints. These problems may be the first sign of a life-threatening allergic reaction. Call your local emergency services (911 in [place] MAKE SURE YOU:   Understand these instructions.  Will watch your condition.  Will get help right away if you are not doing well or get worse. Document Released: 04/02/2008 Document Revised: 12/24/2011 Document Reviewed: 09/17/2011 [facility] Patient Information 2015 [place], [place]. This information is not intended to replace advice given to you by your health care provider. Make sure you discuss any questions you have with your health care provider.

## 2016-05-18 ENCOUNTER — Emergency Department (HOSPITAL_COMMUNITY)
Admission: EM | Admit: 2016-05-18 | Discharge: 2016-05-18 | Disposition: A | Discharge status: Home / Self Care | Payer: Self-pay | Attending: Emergency Medicine | Admitting: Emergency Medicine

## 2016-05-18 ENCOUNTER — Encounter (HOSPITAL_COMMUNITY): Payer: Self-pay | Admitting: Emergency Medicine

## 2016-05-18 DIAGNOSIS — Z87891 Personal history of nicotine dependence: Secondary | ICD-10-CM | POA: Insufficient documentation

## 2016-05-18 DIAGNOSIS — N451 Epididymitis: Secondary | ICD-10-CM | POA: Insufficient documentation

## 2016-05-18 MED ORDER — CIPROFLOXACIN HCL 250 MG PO TABS
500.0000 mg | ORAL_TABLET | Freq: Once | ORAL | Status: AC
  Administered 2016-05-18: 500 mg via ORAL
  Filled 2016-05-18: qty 2

## 2016-05-18 MED ORDER — CIPROFLOXACIN HCL 500 MG PO TABS: 500.0000 mg | ORAL_TABLET | Freq: Two times a day (BID) | ORAL | 0 refills | Status: DC

## 2016-05-18 MED ORDER — OXYCODONE-ACETAMINOPHEN 5-325 MG PO TABS
2.0000 | ORAL_TABLET | Freq: Once | ORAL | Status: AC
  Administered 2016-05-18: 2 via ORAL
  Filled 2016-05-18: qty 2

## 2016-05-18 MED ORDER — KETOROLAC TROMETHAMINE 60 MG/2ML IM SOLN
60.0000 mg | Freq: Once | INTRAMUSCULAR | Status: AC
  Administered 2016-05-18: 60 mg via INTRAMUSCULAR
  Filled 2016-05-18: qty 2

## 2016-05-18 MED ORDER — IBUPROFEN 600 MG PO TABS: 600.0000 mg | ORAL_TABLET | Freq: Three times a day (TID) | ORAL | 0 refills | Status: DC | PRN

## 2016-05-18 NOTE — ED Notes (Signed)
ED Provider at bedside. 

## 2016-05-18 NOTE — ED Provider Notes (Signed)
[facility] DEPT Provider Note   CSN: [id number] Arrival date & time: 05/18/16  0734     History   Chief Complaint Chief Complaint  Patient presents with  . Testicle Pain    HPI [place] A Jolyn LentSetliff is a 45 y.o. male.  HPI Patient began having right testicular pain on Thursday and reports worsening pain today's.  He has a long-standing history of recurrent epididymitis.  No fever.  Denies vomiting.  Reports occasional nausea.  No abdominal pain or back pain.  No dysuria or urinary frequency.  No new sexual contacts.  Denies penile discharge.  Reports pain is located in his right scrotum and testicle.  Reports no redness or mass of the scrotum noted by the patient.  No other complaints   Past Medical History:  Diagnosis Date  . Epididymitis, bilateral   . Pancreatitis, acute     Patient Active Problem List   Diagnosis Date Noted  . Major depression 05/08/2012  . 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 OBSTRUCTION OF BILE DUCT 03/28/2009    Past Surgical History:  Procedure Laterality Date  . CHOLECYSTECTOMY    . KNEE SURGERY         Home Medications    Prior to Admission medications   Medication Sig Start Date End Date Taking? Authorizing Provider  acetaminophen (TYLENOL) 500 MG tablet Take 1,000 mg by mouth every 6 (six) hours as needed for pain.    Historical Provider, MD  diphenhydrAMINE (BENADRYL) 25 MG tablet Take 1 tablet (25 mg total) by mouth every 6 (six) hours. 03/21/15   Rolland PorterMark James, MD  predniSONE (DELTASONE) 20 MG tablet Take 1 tablet (20 mg total) by mouth 2 (two) times daily with a meal. 03/21/15   Rolland PorterMark James, MD    Family History History reviewed. No pertinent family history.  Social History Social History  Substance Use Topics  . Smoking status: Former Games developermoker  . Smokeless tobacco: Never Used  . Alcohol use No     Allergies   Zolpidem tartrate   Review of Systems Review of Systems  All other systems reviewed and are negative.    Physical  Exam Updated Vital Signs BP (!) 159/113 (BP Location: Right Arm)   Pulse 118   Temp 98.9 F (37.2 C) (Oral)   Resp 20   SpO2 99%   Physical Exam  Constitutional: He is oriented to person, place, and time. He appears well-developed and well-nourished.  HENT:  Head: Normocephalic.  Eyes: EOM are normal.  Neck: Normal range of motion.  Pulmonary/Chest: Effort normal.  Abdominal: He exhibits no distension.  Genitourinary:  Genitourinary Comments: Normal circumcised penis.  Mild tenderness of the right testicle without mass noted.  No abnormal lie of the testicle.  Normal overlying right-sided scrotum.  No inguinal hernia palpated on the right  Musculoskeletal: Normal range of motion.  Neurological: He is alert and oriented to person, place, and time.  Psychiatric: He has a normal mood and affect.  Nursing note and vitals reviewed.    ED Treatments / Results  Labs (all labs ordered are listed, but only abnormal results are displayed) Labs Reviewed - No data to display  EKG  EKG Interpretation None       Radiology No results found.  Procedures Procedures (including critical care time)  Medications Ordered in ED Medications  ciprofloxacin (CIPRO) tablet 500 mg (500 mg Oral Given 05/18/16 0802)  oxyCODONE-acetaminophen (PERCOCET/ROXICET) 5-325 MG per tablet 2 tablet (2 tablets Oral Given 05/18/16 0802)  ketorolac (TORADOL)  injection 60 mg (60 mg Intramuscular Given 05/18/16 0802)     Initial Impression / Assessment and Plan / ED Course  I have reviewed the triage vital signs and the nursing notes.  Pertinent labs & imaging results that were available during my care of the patient were reviewed by me and considered in my medical decision making (see chart for details).  Clinical Course    I suspect this is recurrent epididymitis for the patient.  He has no dysuria or urinary frequency.  Patient we discharged home on anti-inflammatories and ciprofloxacin.  Outpatient  urology follow-up.  Doubt torsion.  No signs to suggest hernia.ureteral stone.  No signs to suggest scrotal cellulitis.   Final Clinical Impressions(s) / ED Diagnoses   Final diagnoses:  None    New Prescriptions New Prescriptions   No medications on file     Azalia BilisKevin Yassir Enis, MD 05/18/16 [phone]

## 2016-05-18 NOTE — ED Triage Notes (Signed)
Pt states that he began having pain in right testicle on Thursday and it has worsened today.  Pt has a hx of epididymitis.  Pt denies having fever and has been nauseous with the pain.

## 2016-10-24 ENCOUNTER — Emergency Department (HOSPITAL_COMMUNITY): Payer: Self-pay

## 2016-10-24 ENCOUNTER — Emergency Department (HOSPITAL_COMMUNITY)
Admission: EM | Admit: 2016-10-24 | Discharge: 2016-10-24 | Disposition: A | Discharge status: Home / Self Care | Payer: Self-pay | Attending: Emergency Medicine | Admitting: Emergency Medicine

## 2016-10-24 ENCOUNTER — Encounter (HOSPITAL_COMMUNITY): Payer: Self-pay | Admitting: Emergency Medicine

## 2016-10-24 DIAGNOSIS — Z87891 Personal history of nicotine dependence: Secondary | ICD-10-CM | POA: Insufficient documentation

## 2016-10-24 DIAGNOSIS — Y929 Unspecified place or not applicable: Secondary | ICD-10-CM | POA: Insufficient documentation

## 2016-10-24 DIAGNOSIS — Y93E5 Activity, floor mopping and cleaning: Secondary | ICD-10-CM | POA: Insufficient documentation

## 2016-10-24 DIAGNOSIS — Z23 Encounter for immunization: Secondary | ICD-10-CM | POA: Insufficient documentation

## 2016-10-24 DIAGNOSIS — S91331A Puncture wound without foreign body, right foot, initial encounter: Secondary | ICD-10-CM | POA: Insufficient documentation

## 2016-10-24 DIAGNOSIS — W450XXA Nail entering through skin, initial encounter: Secondary | ICD-10-CM | POA: Insufficient documentation

## 2016-10-24 DIAGNOSIS — Y999 Unspecified external cause status: Secondary | ICD-10-CM | POA: Insufficient documentation

## 2016-10-24 MED ORDER — AMOXICILLIN-POT CLAVULANATE 875-125 MG PO TABS
1.0000 | ORAL_TABLET | Freq: Once | ORAL | Status: AC
  Administered 2016-10-24: 1 via ORAL
  Filled 2016-10-24: qty 1

## 2016-10-24 MED ORDER — AMOXICILLIN-POT CLAVULANATE 875-125 MG PO TABS: 1.0000 | ORAL_TABLET | Freq: Two times a day (BID) | ORAL | 0 refills | Status: DC

## 2016-10-24 MED ORDER — TETANUS-DIPHTH-ACELL PERTUSSIS 5-2.5-18.5 LF-MCG/0.5 IM SUSP
0.5000 mL | Freq: Once | INTRAMUSCULAR | Status: AC
  Administered 2016-10-24: 0.5 mL via INTRAMUSCULAR
  Filled 2016-10-24: qty 0.5

## 2016-10-24 MED ORDER — HYDROCODONE-ACETAMINOPHEN 5-325 MG PO TABS: ORAL_TABLET | ORAL | 0 refills | Status: DC

## 2016-10-24 NOTE — ED Notes (Signed)
Wound cleaned with wound cleaner and wrapped with cling wrap.

## 2016-10-24 NOTE — Discharge Instructions (Signed)
Clean the wound with mild soap and water and keep it bandaged.  Follow-up with your primary doctor or return to ER for any signs of infection

## 2016-10-24 NOTE — ED Triage Notes (Signed)
Stepped on a rusty nail at 0900 today.  Last tetanus shot was 25 years ago

## 2016-10-24 NOTE — ED Notes (Signed)
Pt made aware to return if symptoms worsen or if any life threatening symptoms occur.   

## 2016-10-26 NOTE — ED Provider Notes (Signed)
[facility] Provider Note   CSN: [id number] Arrival date & time: 10/24/16  1336     History   Chief Complaint Chief Complaint  Patient presents with  . Puncture Wound    HPI [name] [name] is a 46 y.o. male.  HPI   [name] [name] is a 46 y.o. male who presents to the Emergency Department complaining of pain to his right foot that began several hours prior to arrival.  He states that he stepped on a large nail that punctured his foot thorough a soft soled boot.  He states that he continued to work, but noticed increasing pain.  He is requesting a tetanus injection.  He denies redness, swelling, drainage or numbness of the foot.    Past Medical History:  Diagnosis Date  . Epididymitis, bilateral   . Pancreatitis, acute     Patient Active Problem List   Diagnosis Date Noted  . Major depression 05/08/2012  . 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 OBSTRUCTION OF BILE DUCT 03/28/2009    Past Surgical History:  Procedure Laterality Date  . CHOLECYSTECTOMY    . KNEE SURGERY         Home Medications    Prior to Admission medications   Medication Sig Start Date End Date Taking? Authorizing Provider  acetaminophen (TYLENOL) 500 MG tablet Take 1,000 mg by mouth every 6 (six) hours as needed for pain.    Historical Provider, MD  amoxicillin-clavulanate (AUGMENTIN) 875-125 MG tablet Take 1 tablet by mouth every 12 (twelve) hours. 10/24/16   Digna Countess, PA-C  ciprofloxacin (CIPRO) 500 MG tablet Take 1 tablet (500 mg total) by mouth 2 (two) times daily. 05/18/16   Azalia Bilis, MD  diphenhydrAMINE (BENADRYL) 25 MG tablet Take 1 tablet (25 mg total) by mouth every 6 (six) hours. 03/21/15   Rolland Porter, MD  HYDROcodone-acetaminophen (NORCO/VICODIN) 5-325 MG tablet Take one-two tabs po q 4-6 hrs prn pain 10/24/16   Chamara Dyck, PA-C  ibuprofen (ADVIL,MOTRIN) 600 MG tablet Take 1 tablet (600 mg total) by mouth every 8 (eight) hours as needed. 05/18/16   Azalia Bilis, MD    predniSONE (DELTASONE) 20 MG tablet Take 1 tablet (20 mg total) by mouth 2 (two) times daily with a meal. 03/21/15   Rolland Porter, MD    Family History History reviewed. No pertinent family history.  Social History Social History  Substance Use Topics  . Smoking status: Former Games developer  . Smokeless tobacco: Never Used  . Alcohol use No     Allergies   Zolpidem tartrate   Review of Systems Review of Systems  Constitutional: Negative for chills and fever.  Respiratory: Negative for chest tightness.   Genitourinary: Negative.   Musculoskeletal: Positive for arthralgias. Negative for joint swelling.  Skin: Positive for wound. Negative for color change.       Puncture wound right foot.  Neurological: Negative for weakness and numbness.  Hematological: Negative for adenopathy.  All other systems reviewed and are negative.    Physical Exam Updated Vital Signs BP 116/82 (BP Location: Left Arm)   Pulse 95   Temp 98.6 F (37 C) (Oral)   Resp 16   Ht 6' (1.829 m)   Wt 117.9 kg   SpO2 98%   BMI 35.26 kg/m   Physical Exam  Constitutional: He is oriented to person, place, and time. He appears well-developed and well-nourished. No distress.  HENT:  Head: Normocephalic and atraumatic.  Cardiovascular: Normal rate, regular rhythm, normal heart sounds and  intact distal pulses.   Pulmonary/Chest: Effort normal and breath sounds normal.  Musculoskeletal: He exhibits tenderness. He exhibits no edema.  ttp of the plantar surface of the right foot.  No erythema, abrasion, bleeding, or lymphangitis.  No proximal tenderness.  Neurological: He is alert and oriented to person, place, and time. He exhibits normal muscle tone. Coordination normal.  Skin: Skin is warm and dry.  Nursing note and vitals reviewed.    ED Treatments / Results  Labs (all labs ordered are listed, but only abnormal results are displayed) Labs Reviewed - No data to display  EKG  EKG Interpretation None        Radiology Dg Foot Complete Right  Result Date: 10/24/2016 CLINICAL DATA:  Stepped on a rusty nail while cleaning up a barn. EXAM: RIGHT FOOT COMPLETE - 3+ VIEW COMPARISON:  None. FINDINGS: No fracture. No subluxation or dislocation. No focal bony abnormality. No evidence for retained radiopaque soft tissue foreign body in the plantar aspect of the foot. IMPRESSION: Negative. Electronically Signed   By: Kennith Center M.D.   On: 10/24/2016 15:12     Procedures Procedures (including critical care time)  Medications Ordered in ED Medications  Tdap (BOOSTRIX) injection 0.5 mL (0.5 mLs Intramuscular Given 10/24/16 1526)  amoxicillin-clavulanate (AUGMENTIN) 875-125 MG per tablet 1 tablet (1 tablet Oral Given 10/24/16 1527)     Initial Impression / Assessment and Plan / ED Course  I have reviewed the triage vital signs and the nursing notes.  Pertinent labs & imaging results that were available during my care of the patient were reviewed by me and considered in my medical decision making (see chart for details).     XR neg for fx.  NV intact.  Puncture wound to the foot.  Wound cleaned and bandaged by nursing, Td updated.  rx for augmentin.  Wound infection precautions discussed.  Pt agrees to plan and wound care instructions.    Final Clinical Impressions(s) / ED Diagnoses   Final diagnoses:  Puncture wound of right foot, initial encounter    New Prescriptions Discharge Medication List as of 10/24/2016  4:08 PM    START taking these medications   Details  amoxicillin-clavulanate (AUGMENTIN) 875-125 MG tablet Take 1 tablet by mouth every 12 (twelve) hours., Starting Thu 10/24/2016, Print    HYDROcodone-acetaminophen (NORCO/VICODIN) 5-325 MG tablet Take one-two tabs po q 4-6 hrs prn pain, Print         Pauline Aus, PA-C 10/26/16 2129    Samuel Jester, DO 10/27/16 1659

## 2017-02-20 ENCOUNTER — Encounter (HOSPITAL_COMMUNITY): Payer: Self-pay

## 2017-02-20 ENCOUNTER — Emergency Department (HOSPITAL_COMMUNITY)
Admission: EM | Admit: 2017-02-20 | Discharge: 2017-02-20 | Discharge status: Against Medical Advice | Payer: Self-pay | Attending: Emergency Medicine | Admitting: Emergency Medicine

## 2017-02-20 ENCOUNTER — Emergency Department (HOSPITAL_COMMUNITY): Payer: Self-pay

## 2017-02-20 DIAGNOSIS — Z79899 Other long term (current) drug therapy: Secondary | ICD-10-CM | POA: Insufficient documentation

## 2017-02-20 DIAGNOSIS — R42 Dizziness and giddiness: Secondary | ICD-10-CM | POA: Insufficient documentation

## 2017-02-20 DIAGNOSIS — Z87891 Personal history of nicotine dependence: Secondary | ICD-10-CM | POA: Insufficient documentation

## 2017-02-20 HISTORY — DX: Other psychoactive substance abuse, in remission: F19.11

## 2017-02-20 LAB — COMPREHENSIVE METABOLIC PANEL
ALT: 19 U/L (ref 17–63)
AST: 17 U/L (ref 15–41)
Albumin: 3.9 g/dL (ref 3.5–5.0)
Alkaline Phosphatase: 81 U/L (ref 38–126)
Anion gap: 9 (ref 5–15)
BUN: 9 mg/dL (ref 6–20)
CHLORIDE: 104 mmol/L (ref 101–111)
CO2: 23 mmol/L (ref 22–32)
CREATININE: 0.81 mg/dL (ref 0.61–1.24)
Calcium: 8.7 mg/dL — ABNORMAL LOW (ref 8.9–10.3)
GFR calc non Af Amer: >60 mL/min (ref >60)
Glucose, Bld: 89 mg/dL (ref 65–99)
POTASSIUM: 3.8 mmol/L (ref 3.5–5.1)
SODIUM: 136 mmol/L (ref 135–145)
Total Bilirubin: 0.3 mg/dL (ref 0.3–1.2)
Total Protein: 7.4 g/dL (ref 6.5–8.1)

## 2017-02-20 LAB — CBC WITH DIFFERENTIAL/PLATELET
Basophils Absolute: 0 10*3/uL (ref 0.0–0.1)
Basophils Relative: 0 %
EOS PCT: 2 %
Eosinophils Absolute: 0.3 10*3/uL (ref 0.0–0.7)
HEMATOCRIT: 45.2 % (ref 39.0–52.0)
Hemoglobin: 15 g/dL (ref 13.0–17.0)
LYMPHS ABS: 4 10*3/uL (ref 0.7–4.0)
Lymphocytes Relative: 30 %
MCH: 31.3 pg (ref 26.0–34.0)
MCHC: 33.2 g/dL (ref 30.0–36.0)
MCV: 94.2 fL (ref 78.0–100.0)
Monocytes Absolute: 1.7 10*3/uL — ABNORMAL HIGH (ref 0.1–1.0)
Monocytes Relative: 13 %
NEUTROS ABS: 7.5 10*3/uL (ref 1.7–7.7)
NEUTROS PCT: 55 %
PLATELETS: 286 10*3/uL (ref 150–400)
RBC: 4.8 MIL/uL (ref 4.22–5.81)
RDW: 13.7 % (ref 11.5–15.5)
WBC: 13.5 10*3/uL — AB (ref 4.0–10.5)

## 2017-02-20 LAB — ETHANOL: Alcohol, Ethyl (B): <5 mg/dL (ref <5)

## 2017-02-20 LAB — TROPONIN I: Troponin I: 0.03 ng/mL (ref <0.03)

## 2017-02-20 NOTE — ED Provider Notes (Signed)
[facility] Provider Note   CSN: [id number] Arrival date & time: 02/20/17  1403     History   Chief Complaint Chief Complaint  Patient presents with  . Altered Mental Status    HPI [name] [name] is a 46 y.o. male.  HPI  patient with altered mental status. Did hit his head earlier today on a cabinet.No loss conscious. Slight headache. Later had some confusion. [facility] Does not really remember how he got here reportedly was brought in by friends or family members. States he is confused and is not exactly know what is going on. Denies substance abuse. His somewhat anxious.  Past Medical History:  Diagnosis Date  . Epididymitis, bilateral   . Hx of substance abuse    clean x 4 years  . Pancreatitis, acute     Patient Active Problem List   Diagnosis Date Noted  . Major depression 05/08/2012  . 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 OBSTRUCTION OF BILE DUCT 03/28/2009    Past Surgical History:  Procedure Laterality Date  . CHOLECYSTECTOMY    . KNEE SURGERY         Home Medications    Prior to Admission medications   Medication Sig Start Date End Date Taking? Authorizing Provider  naproxen sodium (ANAPROX) 220 MG tablet Take 440 mg by mouth. For headache   Yes [provider]    Family History No family history on file.  Social History Social History  Substance Use Topics  . Smoking status: Former Games developermoker  . Smokeless tobacco: Never Used  . Alcohol use No     Allergies   Zolpidem tartrate   Review of Systems Review of Systems  Unable to perform ROS: Mental status change  Psychiatric/Behavioral: The patient is nervous/anxious.      Physical Exam Updated Vital Signs BP (!) 174/89 (BP Location: Right Arm)   Pulse 74   Temp 98 F (36.7 C) (Oral)   Resp 19   Wt 117.9 kg (260 lb)   SpO2 96%   BMI 35.26 kg/m   Physical Exam  Constitutional: He is oriented to person, place, and time. He appears  well-developed.  HENT:  Head: Atraumatic.  Eyes: EOM are normal.  Neck: Neck supple.  Cardiovascular: Normal rate.   Pulmonary/Chest: Effort normal.  Abdominal: He exhibits no distension.  Musculoskeletal: Normal range of motion.  Neurological: He is alert and oriented to person, place, and time.  Patient is awake and anxious. Able answer questions about what happened but does not remember some of the recent events. Remembers hitting his head. Moving all extremities. Follows commands. Appears creating new memories. Face is symmetric. Pupils reactive.  Skin: Skin is warm.     ED Treatments / Results  Labs (all labs ordered are listed, but only abnormal results are displayed) Labs Reviewed  COMPREHENSIVE METABOLIC PANEL - Abnormal; Notable for the following:       Result Value   Calcium 8.7 (*)    All other components within normal limits  CBC WITH DIFFERENTIAL/PLATELET - Abnormal; Notable for the following:    WBC 13.5 (*)    Monocytes Absolute 1.7 (*)    All other components within normal limits  ETHANOL  RAPID URINE DRUG SCREEN, HOSP PERFORMED    EKG  EKG Interpretation  Date/Time:  Thursday February 20 2017 14:06:34 EDT Ventricular Rate:  85 PR Interval:    QRS Duration: 97 QT Interval:  374 QTC Calculation: 445 R  Axis:   80 Text Interpretation:  Sinus rhythm ST elev, probable normal early repol pattern Baseline wander in lead(s) V2 Confirmed by Benjiman Core [phone]) on 02/20/2017 3:16:59 PM       Radiology Ct Head Wo Contrast  Result Date: 02/20/2017 CLINICAL DATA:  Confusion, headache, status post trauma EXAM: CT HEAD WITHOUT CONTRAST TECHNIQUE: Contiguous axial images were obtained from the base of the skull through the vertex without intravenous contrast. COMPARISON:  None. FINDINGS: Brain: No evidence of acute infarction, hemorrhage, hydrocephalus, extra-axial collection or mass lesion/mass effect. Vascular: No hyperdense vessel or unexpected calcification.  Skull: No osseous abnormality. Sinuses/Orbits: Visualized paranasal sinuses are clear. Visualized mastoid sinuses are clear. Visualized orbits demonstrate no focal abnormality. Other: None IMPRESSION: No acute intracranial pathology. Electronically Signed   By: Elige Ko   On: 02/20/2017 14:49    Procedures Procedures (including critical care time)  Medications Ordered in ED Medications - No data to display   Initial Impression / Assessment and Plan / ED Course  I have reviewed the triage vital signs and the nursing notes.  Pertinent labs & imaging results that were available during my care of the patient were reviewed by me and considered in my medical decision making (see chart for details).     Patient presents with dizziness. Followed by lightheadedness and some confusion. Now feels back to baseline. Labs reassuring. States does not know what happened. Was reportedly pale. No chest pain. Has not had episodes like this before. Likely will be able to discharge home but urine drug screen is pending.  Final Clinical Impressions(s) / ED Diagnoses   Final diagnoses:  Dizziness    New Prescriptions New Prescriptions   No medications on file     Benjiman Core, MD 02/20/17 1528

## 2017-02-20 NOTE — Discharge Instructions (Signed)
Follow-up with primary care doctor for further evaluation.

## 2017-02-20 NOTE — ED Provider Notes (Signed)
1625:  Pt received at sign out with troponin, urine, and orthostatic VS pending. I was told by ED RN tha pt ambulated the hallways and then approached the nurses' station and stated he was leaving because he "felt fine." Pt then left the ED.    Samuel JesterMcManus, Tameia Rafferty, DO 02/20/17 1627

## 2017-02-20 NOTE — ED Notes (Signed)
Patient ambulating in hallway with not difficulties. Patient approached nurses station and stated that he wanted to leave. He states, "I feel fine now and don't think I want sit around here feeling ok."  Patient departed AMA.

## 2017-02-20 NOTE — ED Triage Notes (Addendum)
Pt brought in by friend. States he hit his head on cabinet at the job site this morning, no loss of consciousness at time now states cant control movement and confused. Unable to remember things. Pt is pale and  diaphoretic and speech is delayed at times. Reports dizziness and changes started approx one hour ago [facility]

## 2017-02-20 NOTE — ED Notes (Signed)
Patient left without signing discharge. 

## 2017-02-24 LAB — CBG MONITORING, ED: Glucose-Capillary: 76 mg/dL (ref 65–99)

## 2018-03-04 ENCOUNTER — Emergency Department (HOSPITAL_COMMUNITY): Payer: Self-pay

## 2018-03-04 ENCOUNTER — Other Ambulatory Visit: Payer: Self-pay

## 2018-03-04 ENCOUNTER — Emergency Department (HOSPITAL_COMMUNITY)
Admission: EM | Admit: 2018-03-04 | Discharge: 2018-03-04 | Discharge status: Against Medical Advice | Payer: Self-pay | Attending: Emergency Medicine | Admitting: Emergency Medicine

## 2018-03-04 ENCOUNTER — Encounter (HOSPITAL_COMMUNITY): Payer: Self-pay | Admitting: Emergency Medicine

## 2018-03-04 DIAGNOSIS — Z5321 Procedure and treatment not carried out due to patient leaving prior to being seen by health care provider: Secondary | ICD-10-CM

## 2018-03-04 DIAGNOSIS — R42 Dizziness and giddiness: Secondary | ICD-10-CM | POA: Insufficient documentation

## 2018-03-04 LAB — URINALYSIS, ROUTINE W REFLEX MICROSCOPIC
Bilirubin Urine: NEGATIVE
Glucose, UA: NEGATIVE mg/dL
Hgb urine dipstick: NEGATIVE
Ketones, ur: NEGATIVE mg/dL
Leukocytes, UA: NEGATIVE
Nitrite: NEGATIVE
Protein, ur: NEGATIVE mg/dL
Specific Gravity, Urine: 1.006 (ref 1.005–1.030)
pH: 5 (ref 5.0–8.0)

## 2018-03-04 LAB — CBC WITH DIFFERENTIAL/PLATELET
Basophils Absolute: 0 10*3/uL (ref 0.0–0.1)
Basophils Relative: 0 %
Eosinophils Absolute: 0.3 10*3/uL (ref 0.0–0.7)
Eosinophils Relative: 3 %
HCT: 41.7 % (ref 39.0–52.0)
Hemoglobin: 13.8 g/dL (ref 13.0–17.0)
Lymphocytes Relative: 30 %
Lymphs Abs: 2.9 10*3/uL (ref 0.7–4.0)
MCH: 31.1 pg (ref 26.0–34.0)
MCHC: 33.1 g/dL (ref 30.0–36.0)
MCV: 93.9 fL (ref 78.0–100.0)
Monocytes Absolute: 1.1 10*3/uL — ABNORMAL HIGH (ref 0.1–1.0)
Monocytes Relative: 11 %
Neutro Abs: 5.3 10*3/uL (ref 1.7–7.7)
Neutrophils Relative %: 56 %
Platelets: 298 10*3/uL (ref 150–400)
RBC: 4.44 MIL/uL (ref 4.22–5.81)
RDW: 13.5 % (ref 11.5–15.5)
WBC: 9.6 10*3/uL (ref 4.0–10.5)

## 2018-03-04 LAB — COMPREHENSIVE METABOLIC PANEL
ALT: 20 U/L (ref 0–44)
ANION GAP: 8 (ref 5–15)
AST: 20 U/L (ref 15–41)
Albumin: 4.1 g/dL (ref 3.5–5.0)
Alkaline Phosphatase: 84 U/L (ref 38–126)
BILIRUBIN TOTAL: 0.6 mg/dL (ref 0.3–1.2)
BUN: 16 mg/dL (ref 6–20)
CO2: 26 mmol/L (ref 22–32)
Calcium: 8.9 mg/dL (ref 8.9–10.3)
Chloride: 102 mmol/L (ref 98–111)
Creatinine, Ser: 1.05 mg/dL (ref 0.61–1.24)
GFR calc non Af Amer: >60 mL/min (ref >60)
Glucose, Bld: 129 mg/dL — ABNORMAL HIGH (ref 70–99)
Potassium: 3.5 mmol/L (ref 3.5–5.1)
Sodium: 136 mmol/L (ref 135–145)
TOTAL PROTEIN: 7.6 g/dL (ref 6.5–8.1)

## 2018-03-04 LAB — D-DIMER, QUANTITATIVE: D-Dimer, Quant: <0.27 ug{FEU}/mL (ref 0.00–0.50)

## 2018-03-04 LAB — TROPONIN I

## 2018-03-04 LAB — CBG MONITORING, ED: Glucose-Capillary: 131 mg/dL — ABNORMAL HIGH (ref 70–99)

## 2018-03-04 MED ORDER — MECLIZINE HCL 12.5 MG PO TABS
25.0000 mg | ORAL_TABLET | Freq: Once | ORAL | Status: AC
  Administered 2018-03-04: 25 mg via ORAL
  Filled 2018-03-04: qty 2

## 2018-03-04 MED ORDER — LACTATED RINGERS IV BOLUS
1000.0000 mL | Freq: Once | INTRAVENOUS | Status: AC
  Administered 2018-03-04: 1000 mL via INTRAVENOUS

## 2018-03-04 NOTE — ED Provider Notes (Signed)
Emergency Department Provider Note   I have reviewed the triage vital signs and the nursing notes.   HISTORY  Chief Complaint Weakness   HPI [name] [name] is a 47 y.o. male without significant past medical history who presents to the emergency department for dizziness.  Patient states that he had a couple episodes of positional vertigo yesterday but resolved without difficulty but then patient was awakened from sleep in the middle of the night with vertigo again.  He decided just to get up and get ready for work and then it came on much worse in the shower to the point where he almost fell down so he came here for evaluation.  He reports no chest pain, abdominal pain, back pain, urinary changes, recent fevers, cough, headaches, vision changes or neurologic changes otherwise.  He does report some mild shortness of breath.  No lower extremity swelling or calf pain. No other associated or modifying symptoms.    Past Medical History:  Diagnosis Date  . Epididymitis, bilateral   . Hx of substance abuse    clean x 4 years  . Pancreatitis, acute     Patient Active Problem List   Diagnosis Date Noted  . Major depression 05/08/2012  . 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 OBSTRUCTION OF BILE DUCT 03/28/2009    Past Surgical History:  Procedure Laterality Date  . CHOLECYSTECTOMY    . KNEE SURGERY      Current Outpatient Rx  . Order #: [id number] Class: Historical Med    Allergies Zolpidem tartrate  No family history on file.  Social History Social History   Tobacco Use  . Smoking status: Former Games developer  . Smokeless tobacco: Never Used  Substance Use Topics  . Alcohol use: No  . Drug use: No    Comment: former drug use, none in 18 mos.  used to take "pain meds"    Review of Systems  All other systems negative except as documented in the HPI. All pertinent positives and negatives as reviewed in the HPI. ____________________________________________   PHYSICAL  EXAM:  VITAL SIGNS: ED Triage Vitals  Enc Vitals Group     BP 03/04/18 0642 138/86     Pulse Rate 03/04/18 0642 85     Resp 03/04/18 0642 13     Temp 03/04/18 0642 98.6 F (37 C)     Temp Source 03/04/18 0642 Oral     SpO2 03/04/18 0642 95 %    Constitutional: Alert and oriented. Well appearing and in no acute distress. Eyes: Conjunctivae are normal. PERRL. EOMI. Head: Atraumatic. Nose: No congestion/rhinnorhea. Mouth/Throat: Mucous membranes are moist.  Oropharynx non-erythematous. Neck: No stridor.  No meningeal signs.   Cardiovascular: Normal rate, regular rhythm. Good peripheral circulation. Grossly normal heart sounds.   Respiratory: Normal respiratory effort.  No retractions. Lungs CTAB. Gastrointestinal: Soft and nontender. No distention.  Musculoskeletal: No lower extremity tenderness nor edema. No gross deformities of extremities. Neurologic:  Normal speech and language. No gross focal neurologic deficits are appreciated.  Skin:  Skin is warm, dry and intact. No rash noted.   ____________________________________________   LABS (all labs ordered are listed, but only abnormal results are displayed)  Labs Reviewed  CBC WITH DIFFERENTIAL/PLATELET - Abnormal; Notable for the following components:      Result Value   Monocytes Absolute 1.1 (*)    All other components within normal limits  COMPREHENSIVE METABOLIC PANEL - Abnormal; Notable for the following components:   Glucose, Bld 129 (*)  All other components within normal limits  URINALYSIS, ROUTINE W REFLEX MICROSCOPIC - Abnormal; Notable for the following components:   Color, Urine STRAW (*)    All other components within normal limits  CBG MONITORING, ED - Abnormal; Notable for the following components:   Glucose-Capillary 131 (*)    All other components within normal limits  TROPONIN I  D-DIMER, QUANTITATIVE (NOT AT [facility])   ____________________________________________  EKG   EKG  Interpretation  Date/Time:  Wednesday March 04 2018 06:37:49 EDT Ventricular Rate:  88 PR Interval:    QRS Duration: 97 QT Interval:  371 QTC Calculation: 449 R Axis:   69 Text Interpretation:  Sinus rhythm Borderline prolonged PR interval RSR' in V1 or V2, probably normal variant ST elev, probable normal early repol pattern Baseline wander in lead(s) V1 V2 significant baseline wander making interpretation difficult, within these limitations no obvious STEMI or abnormaltiies Confirmed by Marily Memos [phone]) on 03/04/2018 6:48:14 AM       EKG Interpretation  Date/Time:  Wednesday March 04 2018 06:37:49 EDT Ventricular Rate:  88 PR Interval:    QRS Duration: 97 QT Interval:  371 QTC Calculation: 449 R Axis:   69 Text Interpretation:  Sinus rhythm Borderline prolonged PR interval RSR' in V1 or V2, probably normal variant ST elev, probable normal early repol pattern Baseline wander in lead(s) V1 V2 significant baseline wander making interpretation difficult, within these limitations no obvious STEMI or abnormaltiies Confirmed by Marily Memos [phone]) on 03/04/2018 6:48:14 AM       ____________________________________________  RADIOLOGY  Dg Chest 2 View  Result Date: 03/04/2018 CLINICAL DATA:  Onset of dizziness and weakness while in the shower this morning. History of previous episodes of pancreatitis, substance abuse, former smoker. EXAM: CHEST - 2 VIEW COMPARISON:  PA and lateral chest x-ray of August 07, 2011 FINDINGS: The lungs are slightly less well inflated today. The interstitial markings are coarse. The heart is top-normal to mildly enlarged. The central pulmonary vascularity is mildly prominent. The trachea is midline. The bony thorax exhibits no acute abnormality. IMPRESSION: Mild interstitial prominence of both lungs more conspicuous today. Mild prominence of the cardiac silhouette and pulmonary vascularity. This may reflect low-grade CHF in the appropriate clinical  setting. Correlation with patient's clinical and laboratory values will be needed. Electronically Signed   By: David  [place] M.D.   On: 03/04/2018 08:06    ____________________________________________   PROCEDURES  Procedure(s) performed:   Procedures   ____________________________________________   INITIAL IMPRESSION / ASSESSMENT AND PLAN / ED COURSE  Patient symptoms certainly sound like vertigo especially with a normal neurologic exam however the fact that it started and worsened acutely while sleeping are worrisome.  Also associated with some shortness of breath and has had a vague dizziness we worried for PE as his initial oxygen saturation was 94% and mildly elevated heart rate.  Also concern for possible dehydration.  Plan to work-up for the same.  Apparently patient eloped prior to reevaluation. Apparently ambulated without difficulty and stated he felt better.   Pertinent labs & imaging results that were available during my care of the patient were reviewed by me and considered in my medical decision making (see chart for details).  ____________________________________________  FINAL CLINICAL IMPRESSION(S) / ED DIAGNOSES  Final diagnoses:  Eloped from emergency department     MEDICATIONS GIVEN DURING THIS VISIT:  Medications  lactated ringers bolus 1,000 mL (0 mLs Intravenous Stopped 03/04/18 0824)  meclizine (ANTIVERT) tablet 25 mg (25 mg  Oral Given 03/04/18 0711)     NEW OUTPATIENT MEDICATIONS STARTED DURING THIS VISIT:  Discharge Medication List as of 03/04/2018  8:26 AM      Note:  This note was prepared with assistance of Dragon voice recognition software. Occasional wrong-word or sound-a-like substitutions may have occurred due to the inherent limitations of voice recognition software.   Marily MemosMesner, Tanyla Stege, MD 03/04/18 [phone]

## 2018-03-04 NOTE — ED Triage Notes (Signed)
Pt reports around 0530 this AM her was in the shower and began to feel dizzy. Pt states he almost fell in the shower he "got so weak." Pt denies pain.

## 2018-03-04 NOTE — ED Notes (Signed)
Pt informed of treatment wanted per EDP., pt states he has to leave to go to work before he loses his job. Pt states he feels better and wants to leave anyway. Pt ambulatory without acute distress. Pt signed AMA forms.

## 2021-07-04 ENCOUNTER — Telehealth (HOSPITAL_COMMUNITY): Payer: Self-pay

## 2021-07-05 NOTE — Telephone Encounter (Signed)
error
